# Patient Record
Sex: Female | Born: 1960 | Race: Black or African American | Hispanic: No | Marital: Married | Smoking: Never smoker
Health system: Southern US, Community
[De-identification: ages and names within clinical notes are randomized; demographics above are authoritative.]

## PROBLEM LIST (undated history)

## (undated) DIAGNOSIS — R011 Cardiac murmur, unspecified: Secondary | ICD-10-CM

## (undated) DIAGNOSIS — I499 Cardiac arrhythmia, unspecified: Secondary | ICD-10-CM

## (undated) DIAGNOSIS — M1990 Unspecified osteoarthritis, unspecified site: Secondary | ICD-10-CM

## (undated) DIAGNOSIS — K219 Gastro-esophageal reflux disease without esophagitis: Secondary | ICD-10-CM

## (undated) DIAGNOSIS — I10 Essential (primary) hypertension: Secondary | ICD-10-CM

## (undated) DIAGNOSIS — D249 Benign neoplasm of unspecified breast: Secondary | ICD-10-CM

## (undated) DIAGNOSIS — R519 Headache, unspecified: Secondary | ICD-10-CM

## (undated) DIAGNOSIS — D573 Sickle-cell trait: Secondary | ICD-10-CM

## (undated) DIAGNOSIS — R51 Headache: Secondary | ICD-10-CM

## (undated) DIAGNOSIS — T4145XA Adverse effect of unspecified anesthetic, initial encounter: Secondary | ICD-10-CM

## (undated) DIAGNOSIS — E119 Type 2 diabetes mellitus without complications: Secondary | ICD-10-CM

## (undated) HISTORY — PX: TONSILLECTOMY: SUR1361

## (undated) HISTORY — PX: TUBAL LIGATION: SHX77

## (undated) HISTORY — PX: ABDOMINAL HYSTERECTOMY: SHX81

## (undated) HISTORY — PX: CHOLECYSTECTOMY: SHX55

## (undated) HISTORY — PX: BREAST LUMPECTOMY: SHX2

## (undated) HISTORY — PX: BACK SURGERY: SHX140

---

## 2011-03-27 DIAGNOSIS — I499 Cardiac arrhythmia, unspecified: Secondary | ICD-10-CM

## 2011-03-27 HISTORY — DX: Cardiac arrhythmia, unspecified: I49.9

## 2015-06-25 DIAGNOSIS — T8859XA Other complications of anesthesia, initial encounter: Secondary | ICD-10-CM

## 2015-06-25 HISTORY — DX: Other complications of anesthesia, initial encounter: T88.59XA

## 2016-08-31 ENCOUNTER — Other Ambulatory Visit: Payer: Self-pay | Admitting: Neurosurgery

## 2016-09-06 ENCOUNTER — Encounter (HOSPITAL_COMMUNITY): Payer: Self-pay | Admitting: *Deleted

## 2016-09-06 ENCOUNTER — Encounter (HOSPITAL_COMMUNITY)
Admission: RE | Admit: 2016-09-06 | Discharge: 2016-09-06 | Disposition: A | Discharge status: Home / Self Care | Payer: PRIVATE HEALTH INSURANCE | Source: Ambulatory Visit | Attending: Neurosurgery | Admitting: Neurosurgery

## 2016-09-06 DIAGNOSIS — Z01812 Encounter for preprocedural laboratory examination: Secondary | ICD-10-CM | POA: Diagnosis present

## 2016-09-06 DIAGNOSIS — Z01810 Encounter for preprocedural cardiovascular examination: Secondary | ICD-10-CM | POA: Insufficient documentation

## 2016-09-06 DIAGNOSIS — I10 Essential (primary) hypertension: Secondary | ICD-10-CM | POA: Diagnosis not present

## 2016-09-06 HISTORY — DX: Adverse effect of unspecified anesthetic, initial encounter: T41.45XA

## 2016-09-06 HISTORY — DX: Sickle-cell trait: D57.3

## 2016-09-06 HISTORY — DX: Gastro-esophageal reflux disease without esophagitis: K21.9

## 2016-09-06 HISTORY — DX: Essential (primary) hypertension: I10

## 2016-09-06 HISTORY — DX: Headache, unspecified: R51.9

## 2016-09-06 HISTORY — DX: Type 2 diabetes mellitus without complications: E11.9

## 2016-09-06 HISTORY — DX: Headache: R51

## 2016-09-06 LAB — BASIC METABOLIC PANEL
Anion gap: 6 (ref 5–15)
BUN: 8 mg/dL (ref 6–20)
CHLORIDE: 99 mmol/L — AB (ref 101–111)
CO2: 29 mmol/L (ref 22–32)
CREATININE: 1.01 mg/dL — AB (ref 0.44–1.00)
Calcium: 9.5 mg/dL (ref 8.9–10.3)
GFR calc Af Amer: >60 mL/min (ref >60)
GFR calc non Af Amer: >60 mL/min (ref >60)
GLUCOSE: 217 mg/dL — AB (ref 65–99)
Potassium: 3.9 mmol/L (ref 3.5–5.1)
Sodium: 134 mmol/L — ABNORMAL LOW (ref 135–145)

## 2016-09-06 LAB — TYPE AND SCREEN
ABO/RH(D): O NEG
ANTIBODY SCREEN: NEGATIVE

## 2016-09-06 LAB — ABO/RH: ABO/RH(D): O NEG

## 2016-09-06 LAB — GLUCOSE, CAPILLARY: Glucose-Capillary: 239 mg/dL — ABNORMAL HIGH (ref 65–99)

## 2016-09-06 LAB — CBC
HEMATOCRIT: 36.2 % (ref 36.0–46.0)
HEMOGLOBIN: 12.2 g/dL (ref 12.0–15.0)
MCH: 27.9 pg (ref 26.0–34.0)
MCHC: 33.7 g/dL (ref 30.0–36.0)
MCV: 82.6 fL (ref 78.0–100.0)
Platelets: 225 10*3/uL (ref 150–400)
RBC: 4.38 MIL/uL (ref 3.87–5.11)
RDW: 14.2 % (ref 11.5–15.5)
WBC: 9.3 10*3/uL (ref 4.0–10.5)

## 2016-09-06 LAB — SURGICAL PCR SCREEN
MRSA, PCR: NEGATIVE
STAPHYLOCOCCUS AUREUS: NEGATIVE

## 2016-09-06 NOTE — Pre-Procedure Instructions (Signed)
Genessa Beman  09/06/2016      [id number] [place] [address] Phone: [phone] Fax: [phone]    Your procedure is scheduled on   Tuesday  09/11/16  Report to [facility] Admitting at 530 A.M.  Call this number if you have problems the morning of surgery:  [phone]   Remember:  Do not eat food or drink liquids after midnight.  Take these medicines the morning of surgery with A SIP OF WATER    CARVEDILOL (COREG),   OXYCODONE IF NEEDED  7 days prior to surgery STOP taking any Aspirin, Aleve, Naproxen, Ibuprofen, Motrin, Advil, Goody's, BC's, all herbal medications, fish oil, and all vitamins, VOLTAREN / DICLOFENAC, EXCEDRIN PM    How to Manage Your Diabetes Before and After Surgery  Why is it important to control my blood sugar before and after surgery? . Improving blood sugar levels before and after surgery helps healing and can limit problems. . A way of improving blood sugar control is eating a healthy diet by: o  Eating less sugar and carbohydrates o  Increasing activity/exercise o  Talking with your doctor about reaching your blood sugar goals . High blood sugars (greater than 180 mg/dL) can raise your risk of infections and slow your recovery, so you will need to focus on controlling your diabetes during the weeks before surgery. . Make sure that the doctor who takes care of your diabetes knows about your planned surgery including the date and location.  How do I manage my blood sugar before surgery? . Check your blood sugar at least 4 times a day, starting 2 days before surgery, to make sure that the level is not too high or low. o Check your blood sugar the morning of your surgery when you wake up and every 2 hours until you get to the Short Stay unit. . If your blood sugar is less than 70 mg/dL, you will need to treat for low blood sugar: o Do not take insulin. o Treat a low blood  sugar (less than 70 mg/dL) with  cup of clear juice (cranberry or apple), 4 glucose tablets, OR glucose gel. o Recheck blood sugar in 15 minutes after treatment (to make sure it is greater than 70 mg/dL). If your blood sugar is not greater than 70 mg/dL on recheck, call [phone] for further instructions. . Report your blood sugar to the short stay nurse when you get to Short Stay.  . If you are admitted to the hospital after surgery: o Your blood sugar will be checked by the staff and you will probably be given insulin after surgery (instead of oral diabetes medicines) to make sure you have good blood sugar levels. o The goal for blood sugar control after surgery is 80-180 mg/dL.              WHAT DO I DO ABOUT MY DIABETES MEDICATION?   Marland Kitchen Do not take oral diabetes medicines (pills) the morning of surgery.  . THE NIGHT BEFORE SURGERY, take __30_________ units of _  TRESIBA__________insulin.       Marland Kitchen HE MORNING OF SURGERY, take _____NONE________ units of __________insulin.  . The day of surgery, do not take other diabetes injectables, including Byetta (exenatide), Bydureon (exenatide ER), Victoza (liraglutide), or Trulicity (dulaglutide).  . If your CBG is greater than 220 mg/dL, you may take  of your sliding scale (correction) dose of insulin.  Other Instructions:          Patient Signature:  Date:   Nurse Signature:  Date:   Reviewed and Endorsed by [facility] Patient Education Committee, August 2015  Do not wear jewelry, make-up or nail polish.  Do not wear lotions, powders, or perfumes, or deoderant.  Do not shave 48 hours prior to surgery.  Men may shave face and neck.  Do not bring valuables to the hospital.  [facility] is not responsible for any belongings or valuables.  Contacts, dentures or bridgework may not be worn into surgery.  Leave your suitcase in the car.  After surgery it may be brought to your room.  For patients admitted to the hospital,  discharge time will be determined by your treatment team.  Patients discharged the day of surgery will not be allowed to drive home.   Name and phone number of your driver:    Special instructions:  [place] - Preparing for Surgery  Before surgery, you can play an important role.  Because skin is not sterile, your skin needs to be as free of germs as possible.  You can reduce the number of germs on you skin by washing with CHG (chlorahexidine gluconate) soap before surgery.  CHG is an antiseptic cleaner which kills germs and bonds with the skin to continue killing germs even after washing.  Please DO NOT use if you have an allergy to CHG or antibacterial soaps.  If your skin becomes reddened/irritated stop using the CHG and inform your nurse when you arrive at Short Stay.  Do not shave (including legs and underarms) for at least 48 hours prior to the first CHG shower.  You may shave your face.  Please follow these instructions carefully:   1.  Shower with CHG Soap the night before surgery and the                                morning of Surgery.  2.  If you choose to wash your hair, wash your hair first as usual with your       normal shampoo.  3.  After you shampoo, rinse your hair and body thoroughly to remove the                      Shampoo.  4.  Use CHG as you would any other liquid soap.  You can apply chg directly       to the skin and wash gently with scrungie or a clean washcloth.  5.  Apply the CHG Soap to your body ONLY FROM THE NECK DOWN.        Do not use on open wounds or open sores.  Avoid contact with your eyes,       ears, mouth and genitals (private parts).  Wash genitals (private parts)       with your normal soap.  6.  Wash thoroughly, paying special attention to the area where your surgery        will be performed.  7.  Thoroughly rinse your body with warm water from the neck down.  8.  DO NOT shower/wash with your normal soap after using and rinsing off       the CHG  Soap.  9.  Pat yourself dry with a clean towel.            10.  Wear clean  pajamas.            11.  Place clean sheets on your bed the night of your first shower and do not        sleep with pets.  Day of Surgery  Do not apply any lotions/deoderants the morning of surgery.  Please wear clean clothes to the hospital/surgery center.    Please read over the following fact sheets that you were given. MRSA Information and Surgical Site Infection Prevention

## 2016-09-06 NOTE — Progress Notes (Signed)
   09/06/16 0936  OBSTRUCTIVE SLEEP APNEA  Have you ever been diagnosed with sleep apnea through a sleep study? No  Do you snore loudly (loud enough to be heard through closed doors)?  1  Do you often feel tired, fatigued, or sleepy during the daytime (such as falling asleep during driving or talking to someone)? 0  Has anyone observed you stop breathing during your sleep? 0  Do you have, or are you being treated for high blood pressure? 1  BMI more than 35 kg/m2? 1  Age > 50 (1-yes) 1  Neck circumference greater than:Female 16 inches or larger, Female 17inches or larger? 1  Female Gender (Yes=1) 0  Obstructive Sleep Apnea Score 5  Score 5 or greater  Results sent to PCP

## 2016-09-07 ENCOUNTER — Encounter (HOSPITAL_COMMUNITY): Payer: Self-pay | Admitting: Emergency Medicine

## 2016-09-07 LAB — HEMOGLOBIN A1C
Hgb A1c MFr Bld: 6.9 % — ABNORMAL HIGH (ref 4.8–5.6)
Mean Plasma Glucose: 151 mg/dL

## 2016-09-07 NOTE — Progress Notes (Signed)
Anesthesia Chart Review:  Pt is a 56 year old female scheduled for L5-S1 PLIF on 09/11/2016 with Consuella Lose, MD.   PMH includes:  HTN, DM, GERD.  Former smoker. BMI 35.  Anesthesia history: pt reported at PAT her "bp dropped during breast surgery" in 2017 at [facility].  Records from 07/04/15 L breast biopsy obtained and are on paper chart.  I do not see any concerning hypotension documented in the anesthesia record.   Medications include: Carvedilol, lisinopril, metformin, simvastatin, tresiba  Reoperative labs reviewed. HbA1c 6.9, glucose 217  EKG 09/06/16: NSR. Minimal voltage criteria for LVH, may be normal variant  If blood glucose acceptable DOS, I anticipate pt can proceed as scheduled.   Willeen Cass, FNP-BC [facility] Short Stay Surgical Center/Anesthesiology Phone: [phone] 09/07/2016 10:09 AM

## 2016-09-11 ENCOUNTER — Inpatient Hospital Stay (HOSPITAL_COMMUNITY): Admission: RE | Admit: 2016-09-11 | Payer: PRIVATE HEALTH INSURANCE | Source: Ambulatory Visit | Admitting: Neurosurgery

## 2016-09-11 ENCOUNTER — Encounter (HOSPITAL_COMMUNITY): Admission: RE | Payer: Self-pay | Source: Ambulatory Visit

## 2016-09-11 SURGERY — POSTERIOR LUMBAR FUSION 1 LEVEL
Anesthesia: General

## 2016-11-13 ENCOUNTER — Other Ambulatory Visit: Payer: Self-pay | Admitting: Neurosurgery

## 2016-11-21 ENCOUNTER — Encounter (HOSPITAL_COMMUNITY): Payer: Self-pay

## 2016-11-21 ENCOUNTER — Encounter (HOSPITAL_COMMUNITY)
Admission: RE | Admit: 2016-11-21 | Discharge: 2016-11-21 | Disposition: A | Discharge status: Home / Self Care | Payer: PRIVATE HEALTH INSURANCE | Source: Ambulatory Visit | Attending: Neurosurgery | Admitting: Neurosurgery

## 2016-11-21 DIAGNOSIS — M5417 Radiculopathy, lumbosacral region: Secondary | ICD-10-CM | POA: Insufficient documentation

## 2016-11-21 DIAGNOSIS — M47897 Other spondylosis, lumbosacral region: Secondary | ICD-10-CM | POA: Diagnosis not present

## 2016-11-21 DIAGNOSIS — Z01812 Encounter for preprocedural laboratory examination: Secondary | ICD-10-CM | POA: Insufficient documentation

## 2016-11-21 HISTORY — DX: Unspecified osteoarthritis, unspecified site: M19.90

## 2016-11-21 HISTORY — DX: Cardiac arrhythmia, unspecified: I49.9

## 2016-11-21 HISTORY — DX: Cardiac murmur, unspecified: R01.1

## 2016-11-21 LAB — TYPE AND SCREEN
ABO/RH(D): O NEG
Antibody Screen: NEGATIVE

## 2016-11-21 LAB — BASIC METABOLIC PANEL
Anion gap: 5 (ref 5–15)
BUN: 6 mg/dL (ref 6–20)
CALCIUM: 9.5 mg/dL (ref 8.9–10.3)
CO2: 27 mmol/L (ref 22–32)
Chloride: 107 mmol/L (ref 101–111)
Creatinine, Ser: 0.99 mg/dL (ref 0.44–1.00)
GFR calc Af Amer: >60 mL/min (ref >60)
GLUCOSE: 184 mg/dL — AB (ref 65–99)
Potassium: 4.1 mmol/L (ref 3.5–5.1)
Sodium: 139 mmol/L (ref 135–145)

## 2016-11-21 LAB — CBC
HCT: 35.4 % — ABNORMAL LOW (ref 36.0–46.0)
Hemoglobin: 11.8 g/dL — ABNORMAL LOW (ref 12.0–15.0)
MCH: 26.9 pg (ref 26.0–34.0)
MCHC: 33.3 g/dL (ref 30.0–36.0)
MCV: 80.6 fL (ref 78.0–100.0)
PLATELETS: 194 10*3/uL (ref 150–400)
RBC: 4.39 MIL/uL (ref 3.87–5.11)
RDW: 13.8 % (ref 11.5–15.5)
WBC: 7.7 10*3/uL (ref 4.0–10.5)

## 2016-11-21 LAB — GLUCOSE, CAPILLARY: GLUCOSE-CAPILLARY: 204 mg/dL — AB (ref 65–99)

## 2016-11-21 LAB — SURGICAL PCR SCREEN
MRSA, PCR: NEGATIVE
STAPHYLOCOCCUS AUREUS: NEGATIVE

## 2016-11-21 LAB — HEMOGLOBIN A1C
Hgb A1c MFr Bld: 8 % — ABNORMAL HIGH (ref 4.8–5.6)
Mean Plasma Glucose: 182.9 mg/dL

## 2016-11-21 NOTE — Pre-Procedure Instructions (Addendum)
Christy Brewer  11/21/2016      [id number] [place] [address] Phone: [phone] Fax: [phone]    Your procedure is scheduled on 11/27/16  Report to [facility] Admitting at 745 A.M.  Call this number if you have problems the morning of surgery:  [phone]   Remember:  Do not eat food or drink liquids after midnight.  Take these medicines the morning of surgery with A SIP OF WATER      Carvedilol(coreg)  STOP all herbel meds, nsaids (aleve,naproxen,advil,ibuprofen) prior to surgery starting today (11/21/16) including all vitamins/supplements,excedrin,probiotic,aspirin,fish oil  No metformin am of surgery  How to Manage Your Diabetes Before and After Surgery  Why is it important to control my blood sugar before and after surgery? . Improving blood sugar levels before and after surgery helps healing and can limit problems. . A way of improving blood sugar control is eating a healthy diet by: o  Eating less sugar and carbohydrates o  Increasing activity/exercise o  Talking with your doctor about reaching your blood sugar goals . High blood sugars (greater than 180 mg/dL) can raise your risk of infections and slow your recovery, so you will need to focus on controlling your diabetes during the weeks before surgery. . Make sure that the doctor who takes care of your diabetes knows about your planned surgery including the date and location.  How do I manage my blood sugar before surgery? . Check your blood sugar at least 4 times a day, starting 2 days before surgery, to make sure that the level is not too high or low. o Check your blood sugar the morning of your surgery when you wake up and every 2 hours until you get to the Short Stay unit. . If your blood sugar is less than 70 mg/dL, you will need to treat for low blood sugar: o Do not take insulin. o Treat a low blood sugar (less than 70 mg/dL)  with  cup of clear juice (cranberry or apple), 4 glucose tablets, OR glucose gel. o Recheck blood sugar in 15 minutes after treatment (to make sure it is greater than 70 mg/dL). If your blood sugar is not greater than 70 mg/dL on recheck, call [phone] for further instructions. . Report your blood sugar to the short stay nurse when you get to Short Stay.  . If you are admitted to the hospital after surgery: o Your blood sugar will be checked by the staff and you will probably be given insulin after surgery (instead of oral diabetes medicines) to make sure you have good blood sugar levels. o The goal for blood sugar control after surgery is 80-180 mg/dL.   WHAT DO I DO ABOUT MY DIABETES MEDICATION?  Do not take oral diabetes medicines (pills) the morning of surgery.(metformin)  . THE NIGHT BEFORE SURGERY, take 30 units of Tresiba insulin.  . Take usual meal doses of Fiasp but no bedtime dose .  .     .  THE MORNING OF SURGERY,do not take any Fiasp insulin . Except as instructed below  . If your CBG is greater than 220 mg/dL, you may take  of your sliding scale (correction) dose of insulin.    Do not wear jewelry, make-up or nail polish.  Do not wear lotions, powders, or perfumes, or deoderant.  Do not shave 48 hours prior to surgery.  Men may shave face and  neck.  Do not bring valuables to the hospital.  [facility] is not responsible for any belongings or valuables.  Contacts, dentures or bridgework may not be worn into surgery.  Leave your suitcase in the car.  After surgery it may be brought to your room.  For patients admitted to the hospital, discharge time will be determined by your treatment team.  Patients discharged the day of surgery will not be allowed to drive home.   Special instructions:   Special Instructions: [place] - Preparing for Surgery  Before surgery, you can play an important role.  Because skin is not sterile, your skin needs to be as free of  germs as possible.  You can reduce the number of germs on you skin by washing with CHG (chlorahexidine gluconate) soap before surgery.  CHG is an antiseptic cleaner which kills germs and bonds with the skin to continue killing germs even after washing.  Please DO NOT use if you have an allergy to CHG or antibacterial soaps.  If your skin becomes reddened/irritated stop using the CHG and inform your nurse when you arrive at Short Stay.  Do not shave (including legs and underarms) for at least 48 hours prior to the first CHG shower.  You may shave your face.  Please follow these instructions carefully:   1.  Shower with CHG Soap the night before surgery and the morning of Surgery.  2.  If you choose to wash your hair, wash your hair first as usual with your normal shampoo.  3.  After you shampoo, rinse your hair and body thoroughly to remove the Shampoo.  4.  Use CHG as you would any other liquid soap.  You can apply chg directly  to the skin and wash gently with scrungie or a clean washcloth.  5.  Apply the CHG Soap to your body ONLY FROM THE NECK DOWN.  Do not use on open wounds or open sores.  Avoid contact with your eyes ears, mouth and genitals (private parts).  Wash genitals (private parts)       with your normal soap.  6.  Wash thoroughly, paying special attention to the area where your surgery will be performed.  7.  Thoroughly rinse your body with warm water from the neck down.  8.  DO NOT shower/wash with your normal soap after using and rinsing off the CHG Soap.  9.  Pat yourself dry with a clean towel.            10.  Wear clean pajamas.            11.  Place clean sheets on your bed the night of your first shower and do not sleep with pets.  Day of Surgery  Do not apply any lotions/deodorants the morning of surgery.  Please wear clean clothes to the hospital/surgery center.  Please read over the  fact sheets that you were given.

## 2016-11-30 NOTE — Progress Notes (Signed)
Anesthesia Chart Review:   Pt is a 56 year old female scheduled for L5-S1 PLIF on 01/01/2017 with Consuella Lose, MD.   - Pt saw cardiologist Raechel Chute, MD in [place], [place] in 2012 for cardiac risk factors.   PMH includes:  HTN, DM, GERD.  Former smoker. BMI 35.  Anesthesia history: pt reported at PAT her "bp dropped during breast surgery" in 2017 at [facility]  Records from 07/04/15 L breast biopsy obtained and reviewed.  I do not see any concerning hypotension documented in the anesthesia record.   Medications include: Carvedilol, fiasp, lisinopril, metformin, simvastatin, tresiba  Reoperative labs reviewed. HbA1c 8.0, glucose 184  EKG 09/06/16: NSR. Minimal voltage criteria for LVH, may be normal variant  Echo 07/03/10 ([facility]): 1. Limited endocardial resolution. 2. Normal RV and LV sizes and systolic function. EF 60-65%. 3. Structurally and functionally normal cardiac valves. 4. Normal estimated RA and LA pressures  Nuclear stress test 07/03/10 ([facility] of [facility]):  1. No induced ischemia, on 85% PMHR.  2. Deconditioning 3. Normal resting systolic LV function. Normal segmental wall motion and thickening. EF 69%  If blood glucose acceptable DOS, I anticipate pt can proceed as scheduled.   Willeen Cass, FNP-BC [facility] Short Stay Surgical Center/Anesthesiology Phone: [phone] 11/30/2016 2:54 PM

## 2016-12-25 ENCOUNTER — Other Ambulatory Visit (HOSPITAL_COMMUNITY): Payer: Self-pay | Admitting: General Practice

## 2016-12-25 NOTE — Pre-Procedure Instructions (Signed)
Christy Brewer  12/25/2016      Your procedure is scheduled on 01/01/2017.  Report to [facility] Admitting at 05:30 A.M.  Call this number if you have problems the morning of surgery:  [phone]   Remember:  Do not eat food or drink liquids after midnight.  Take these medicines the morning of surgery with A SIP OF WATER: carvedilol and neurontin.    WHAT DO I DO ABOUT MY DIABETES MEDICATION?   Marland Kitchen Do not take oral diabetes medicines (pills) the morning of surgery (metformin).  . THE NIGHT BEFORE SURGERY, take 30 units of tresiba insulin.   . Do not take FIASP flextouch the day of surgery.       How to Manage Your Diabetes Before and After Surgery  Why is it important to control my blood sugar before and after surgery? . Improving blood sugar levels before and after surgery helps healing and can limit problems. . A way of improving blood sugar control is eating a healthy diet by: o  Eating less sugar and carbohydrates o  Increasing activity/exercise o  Talking with your doctor about reaching your blood sugar goals . High blood sugars (greater than 180 mg/dL) can raise your risk of infections and slow your recovery, so you will need to focus on controlling your diabetes during the weeks before surgery. . Make sure that the doctor who takes care of your diabetes knows about your planned surgery including the date and location.  How do I manage my blood sugar before surgery? . Check your blood sugar at least 4 times a day, starting 2 days before surgery, to make sure that the level is not too high or low. o Check your blood sugar the morning of your surgery when you wake up and every 2 hours until you get to the Short Stay unit. . If your blood sugar is less than 70 mg/dL, you will need to treat for low blood sugar: o Do not take insulin. o Treat a low blood sugar (less than 70 mg/dL) with  cup of clear juice (cranberry or apple), 4 glucose tablets, OR glucose  gel. o Recheck blood sugar in 15 minutes after treatment (to make sure it is greater than 70 mg/dL). If your blood sugar is not greater than 70 mg/dL on recheck, call [phone] for further instructions. . Report your blood sugar to the short stay nurse when you get to Short Stay.  . If you are admitted to the hospital after surgery: o Your blood sugar will be checked by the staff and you will probably be given insulin after surgery (instead of oral diabetes medicines) to make sure you have good blood sugar levels. o The goal for blood sugar control after surgery is 80-180 mg/dL.     Do not wear jewelry, make-up or nail polish.  Do not wear lotions, powders, or perfumes, or deoderant.  Do not shave 48 hours prior to surgery.   Do not bring valuables to the hospital.  [facility] is not responsible for any belongings or valuables.  Contacts, dentures or bridgework may not be worn into surgery.  Leave your suitcase in the car.  After surgery it may be brought to your room.  For patients admitted to the hospital, discharge time will be determined by your treatment team.  Patients discharged the day of surgery will not be allowed to drive home.   Please read over the fact sheets that you were given.  [place]- Preparing For Surgery  Before surgery, you can play an important role. Because skin is not sterile, your skin needs to be as free of germs as possible. You can reduce the number of germs on your skin by washing with CHG (chlorahexidine gluconate) Soap before surgery.  CHG is an antiseptic cleaner which kills germs and bonds with the skin to continue killing germs even after washing.  Please do not use if you have an allergy to CHG or antibacterial soaps. If your skin becomes reddened/irritated stop using the CHG.  Do not shave (including legs and underarms) for at least 48 hours prior to first CHG shower. It is OK to shave your face.  Please follow these instructions  carefully.   1. Shower the NIGHT BEFORE SURGERY and the MORNING OF SURGERY with CHG.   2. If you chose to wash your hair, wash your hair first as usual with your normal shampoo.  3. After you shampoo, rinse your hair and body thoroughly to remove the shampoo.  4. Use CHG as you would any other liquid soap. You can apply CHG directly to the skin and wash gently with a scrungie or a clean washcloth.   5. Apply the CHG Soap to your body ONLY FROM THE NECK DOWN.  Do not use on open wounds or open sores. Avoid contact with your eyes, ears, mouth and genitals (private parts). Wash genitals (private parts) with your normal soap.  USE REGULAR SHAMPOO AND CONDITIONER FOR HAIR USE REGULAR SOAP FOR FACE AND PRIVATE AREA  6. Wash thoroughly, paying special attention to the area where your surgery will be performed.  7. Thoroughly rinse your body with warm water from the neck down.  8. DO NOT shower/wash with your normal soap after using and rinsing off the CHG Soap.  9. Pat yourself dry with a CLEAN TOWEL and [place] CLOTH  10. Wear CLEAN PAJAMAS to bed the night before surgery, wear comfortable clothes the morning of surgery  11. Place CLEAN SHEETS on your bed the night of your first shower and DO NOT SLEEP WITH PETS.    Day of Surgery: Do not apply any deodorants/lotions. Please wear clean clothes to the hospital/surgery center.

## 2016-12-26 ENCOUNTER — Other Ambulatory Visit: Payer: Self-pay | Admitting: Neurosurgery

## 2016-12-26 ENCOUNTER — Encounter (HOSPITAL_COMMUNITY): Payer: Self-pay

## 2016-12-26 ENCOUNTER — Encounter (HOSPITAL_COMMUNITY)
Admission: RE | Admit: 2016-12-26 | Discharge: 2016-12-26 | Disposition: A | Discharge status: Home / Self Care | Payer: PRIVATE HEALTH INSURANCE | Source: Ambulatory Visit | Attending: Neurosurgery | Admitting: Neurosurgery

## 2016-12-26 DIAGNOSIS — I10 Essential (primary) hypertension: Secondary | ICD-10-CM | POA: Insufficient documentation

## 2016-12-26 DIAGNOSIS — Z01818 Encounter for other preprocedural examination: Secondary | ICD-10-CM | POA: Insufficient documentation

## 2016-12-26 DIAGNOSIS — Z7984 Long term (current) use of oral hypoglycemic drugs: Secondary | ICD-10-CM | POA: Diagnosis not present

## 2016-12-26 DIAGNOSIS — Z87891 Personal history of nicotine dependence: Secondary | ICD-10-CM | POA: Insufficient documentation

## 2016-12-26 DIAGNOSIS — E119 Type 2 diabetes mellitus without complications: Secondary | ICD-10-CM | POA: Insufficient documentation

## 2016-12-26 HISTORY — DX: Benign neoplasm of unspecified breast: D24.9

## 2016-12-26 LAB — CBC
HCT: 37.2 % (ref 36.0–46.0)
Hemoglobin: 12.6 g/dL (ref 12.0–15.0)
MCH: 27.3 pg (ref 26.0–34.0)
MCHC: 33.9 g/dL (ref 30.0–36.0)
MCV: 80.5 fL (ref 78.0–100.0)
PLATELETS: 215 10*3/uL (ref 150–400)
RBC: 4.62 MIL/uL (ref 3.87–5.11)
RDW: 14 % (ref 11.5–15.5)
WBC: 7.6 10*3/uL (ref 4.0–10.5)

## 2016-12-26 LAB — BASIC METABOLIC PANEL
Anion gap: 7 (ref 5–15)
BUN: 7 mg/dL (ref 6–20)
CALCIUM: 9.7 mg/dL (ref 8.9–10.3)
CO2: 28 mmol/L (ref 22–32)
CREATININE: 1.08 mg/dL — AB (ref 0.44–1.00)
Chloride: 102 mmol/L (ref 101–111)
GFR calc non Af Amer: 57 mL/min — ABNORMAL LOW (ref >60)
Glucose, Bld: 157 mg/dL — ABNORMAL HIGH (ref 65–99)
Potassium: 4.2 mmol/L (ref 3.5–5.1)
SODIUM: 137 mmol/L (ref 135–145)

## 2016-12-26 LAB — TYPE AND SCREEN
ABO/RH(D): O NEG
ANTIBODY SCREEN: NEGATIVE

## 2016-12-26 LAB — SURGICAL PCR SCREEN
MRSA, PCR: NEGATIVE
STAPHYLOCOCCUS AUREUS: NEGATIVE

## 2016-12-26 LAB — GLUCOSE, CAPILLARY: GLUCOSE-CAPILLARY: 168 mg/dL — AB (ref 65–99)

## 2016-12-26 NOTE — Progress Notes (Signed)
Called Nundkumar's office for orders for consent.  PCP: Dr. Melina Fiddler @ [facility] in [place]  Cardiologist: Dr. Raechel Chute in [place], [place]-hasn't seen since 2012.  Endocrinologist: Dr. Johann Capers Trivitt in [place], [place]  Fasting sugars 278-300. (pt. States it's due to stress)  Notified Myriam Forehand, diabetic Coordinator, stated pt. Should contact her endocrinologist for better control. Stated for pt. Not to take Fiasp flexpen the morning of surgery only unless it is a correction dose. Pt. States it is not.

## 2016-12-26 NOTE — Pre-Procedure Instructions (Addendum)
Christy Brewer  12/26/2016      Your procedure is scheduled on 01/01/2017.  Report to [facility] Admitting at 05:30 A.M.  Call this number if you have problems the morning of surgery:  [phone]   Remember:  Do not eat food or drink liquids after midnight.  Take these medicines the morning of surgery with A SIP OF WATER: carvedilol and neurontin.    WHAT DO I DO ABOUT MY DIABETES MEDICATION?   Marland Kitchen Do not take oral diabetes medicines (pills) the morning of surgery (metformin).  . THE NIGHT BEFORE SURGERY, take 30 units of tresiba insulin.     How to Manage Your Diabetes Before and After Surgery  Why is it important to control my blood sugar before and after surgery? . Improving blood sugar levels before and after surgery helps healing and can limit problems. . A way of improving blood sugar control is eating a healthy diet by: o  Eating less sugar and carbohydrates o  Increasing activity/exercise o  Talking with your doctor about reaching your blood sugar goals . High blood sugars (greater than 180 mg/dL) can raise your risk of infections and slow your recovery, so you will need to focus on controlling your diabetes during the weeks before surgery. . Make sure that the doctor who takes care of your diabetes knows about your planned surgery including the date and location.  How do I manage my blood sugar before surgery? . Check your blood sugar at least 4 times a day, starting 2 days before surgery, to make sure that the level is not too high or low. o Check your blood sugar the morning of your surgery when you wake up and every 2 hours until you get to the Short Stay unit. . If your blood sugar is less than 70 mg/dL, you will need to treat for low blood sugar: o Do not take insulin. o Treat a low blood sugar (less than 70 mg/dL) with  cup of clear juice (cranberry or apple), 4 glucose tablets, OR glucose gel. o Recheck blood sugar in 15 minutes after treatment  (to make sure it is greater than 70 mg/dL). If your blood sugar is not greater than 70 mg/dL on recheck, call [phone] for further instructions. . Report your blood sugar to the short stay nurse when you get to Short Stay.  . If you are admitted to the hospital after surgery: o Your blood sugar will be checked by the staff and you will probably be given insulin after surgery (instead of oral diabetes medicines) to make sure you have good blood sugar levels. o The goal for blood sugar control after surgery is 80-180 mg/dL.     Do not wear jewelry, make-up or nail polish.  Do not wear lotions, powders, or perfumes, or deoderant.  Do not shave 48 hours prior to surgery.   Do not bring valuables to the hospital.  [facility] is not responsible for any belongings or valuables.  Contacts, dentures or bridgework may not be worn into surgery.  Leave your suitcase in the car.  After surgery it may be brought to your room.  For patients admitted to the hospital, discharge time will be determined by your treatment team.  Patients discharged the day of surgery will not be allowed to drive home.   Please read over the fact sheets that you were given.    [facility]- Preparing For Surgery  Before surgery, you can play an  important role. Because skin is not sterile, your skin needs to be as free of germs as possible. You can reduce the number of germs on your skin by washing with CHG (chlorahexidine gluconate) Soap before surgery.  CHG is an antiseptic cleaner which kills germs and bonds with the skin to continue killing germs even after washing.  Please do not use if you have an allergy to CHG or antibacterial soaps. If your skin becomes reddened/irritated stop using the CHG.  Do not shave (including legs and underarms) for at least 48 hours prior to first CHG shower. It is OK to shave your face.  Please follow these instructions carefully.   1. Shower the NIGHT BEFORE SURGERY and the MORNING  OF SURGERY with CHG.   2. If you chose to wash your hair, wash your hair first as usual with your normal shampoo.  3. After you shampoo, rinse your hair and body thoroughly to remove the shampoo.  4. Use CHG as you would any other liquid soap. You can apply CHG directly to the skin and wash gently with a scrungie or a clean washcloth.   5. Apply the CHG Soap to your body ONLY FROM THE NECK DOWN.  Do not use on open wounds or open sores. Avoid contact with your eyes, ears, mouth and genitals (private parts). Wash genitals (private parts) with your normal soap.  USE REGULAR SHAMPOO AND CONDITIONER FOR HAIR USE REGULAR SOAP FOR FACE AND PRIVATE AREA  6. Wash thoroughly, paying special attention to the area where your surgery will be performed.  7. Thoroughly rinse your body with warm water from the neck down.  8. DO NOT shower/wash with your normal soap after using and rinsing off the CHG Soap.  9. Pat yourself dry with a CLEAN TOWEL and [place] CLOTH  10. Wear CLEAN PAJAMAS to bed the night before surgery, wear comfortable clothes the morning of surgery  11. Place CLEAN SHEETS on your bed the night of your first shower and DO NOT SLEEP WITH PETS.    Day of Surgery: Do not apply any deodorants/lotions. Please wear clean clothes to the hospital/surgery center.

## 2016-12-27 NOTE — Progress Notes (Signed)
Anesthesia Chart Review:  Pt is a 56 year old female scheduled for L5-S1 PLIF on 01/01/2017 with Consuella Lose, MD.   - Pt saw cardiologist Raechel Chute, MD in [place], [place] in 2012 for cardiac risk factors.   PMH includes: HTN, DM, GERD. Former smoker. BMI 34.  Anesthesia history: pt reported at PAT her "bp dropped during breast surgery" in 2017 at [facility]. Records from 07/04/15 L breast biopsy obtained and reviewed. I do not see any concerning hypotension documented in the anesthesia record.   Medications include: Carvedilol, fiasp, lisinopril, metformin, simvastatin, tresiba  Preoperative labs reviewed. Glucose 157. HbA1c 8.0 on 11/21/16  EKG 09/06/16: NSR. Minimal voltage criteria for LVH, may be normal variant  Echo 07/03/10 ([facility]): 1. Limited endocardial resolution. 2. Normal RV and LV sizes and systolic function. EF 60-65%. 3. Structurally and functionally normal cardiac valves. 4. Normal estimated RA and LA pressures  Nuclear stress test 07/03/10 ([facility]):  1. No induced ischemia, on 85% PMHR.  2. Deconditioning 3. Normal resting systolic LV function. Normal segmental wall motion and thickening. EF 69%  If blood glucose acceptable DOS, I anticipate pt can proceed as scheduled.   Willeen Cass, FNP-BC [facility] Short Stay Surgical Center/Anesthesiology Phone: [phone] 12/27/2016 3:55 PM

## 2016-12-31 MED ORDER — CEFAZOLIN SODIUM-DEXTROSE 2-4 GM/100ML-% IV SOLN
2.0000 g | INTRAVENOUS | Status: AC
  Administered 2017-01-01: 2 g via INTRAVENOUS

## 2017-01-01 ENCOUNTER — Inpatient Hospital Stay (HOSPITAL_COMMUNITY)
Admission: RE | Admit: 2017-01-01 | Discharge: 2017-01-02 | DRG: 460 | Disposition: A | Discharge status: Home / Self Care | Payer: PRIVATE HEALTH INSURANCE | Source: Ambulatory Visit | Attending: Neurosurgery | Admitting: Neurosurgery

## 2017-01-01 ENCOUNTER — Encounter (HOSPITAL_COMMUNITY)
Admission: RE | Disposition: A | Discharge status: Home / Self Care | Payer: Self-pay | Source: Ambulatory Visit | Attending: Neurosurgery

## 2017-01-01 ENCOUNTER — Inpatient Hospital Stay (HOSPITAL_COMMUNITY): Payer: PRIVATE HEALTH INSURANCE | Admitting: Vascular Surgery

## 2017-01-01 ENCOUNTER — Encounter (HOSPITAL_COMMUNITY): Payer: Self-pay | Admitting: Urology

## 2017-01-01 ENCOUNTER — Inpatient Hospital Stay (HOSPITAL_COMMUNITY): Payer: PRIVATE HEALTH INSURANCE

## 2017-01-01 ENCOUNTER — Inpatient Hospital Stay (HOSPITAL_COMMUNITY): Payer: PRIVATE HEALTH INSURANCE | Admitting: Certified Registered Nurse Anesthetist

## 2017-01-01 DIAGNOSIS — M4726 Other spondylosis with radiculopathy, lumbar region: Secondary | ICD-10-CM | POA: Diagnosis present

## 2017-01-01 DIAGNOSIS — K219 Gastro-esophageal reflux disease without esophagitis: Secondary | ICD-10-CM | POA: Diagnosis present

## 2017-01-01 DIAGNOSIS — M5117 Intervertebral disc disorders with radiculopathy, lumbosacral region: Secondary | ICD-10-CM | POA: Diagnosis present

## 2017-01-01 DIAGNOSIS — D573 Sickle-cell trait: Secondary | ICD-10-CM | POA: Diagnosis present

## 2017-01-01 DIAGNOSIS — I10 Essential (primary) hypertension: Secondary | ICD-10-CM | POA: Diagnosis present

## 2017-01-01 DIAGNOSIS — M4727 Other spondylosis with radiculopathy, lumbosacral region: Secondary | ICD-10-CM | POA: Diagnosis present

## 2017-01-01 DIAGNOSIS — Z7984 Long term (current) use of oral hypoglycemic drugs: Secondary | ICD-10-CM | POA: Diagnosis not present

## 2017-01-01 DIAGNOSIS — Z419 Encounter for procedure for purposes other than remedying health state, unspecified: Secondary | ICD-10-CM

## 2017-01-01 DIAGNOSIS — E119 Type 2 diabetes mellitus without complications: Secondary | ICD-10-CM | POA: Diagnosis present

## 2017-01-01 DIAGNOSIS — Z79899 Other long term (current) drug therapy: Secondary | ICD-10-CM | POA: Diagnosis not present

## 2017-01-01 LAB — GLUCOSE, CAPILLARY
GLUCOSE-CAPILLARY: 196 mg/dL — AB (ref 65–99)
GLUCOSE-CAPILLARY: 238 mg/dL — AB (ref 65–99)
GLUCOSE-CAPILLARY: 192 mg/dL — AB (ref 65–99)
Glucose-Capillary: 285 mg/dL — ABNORMAL HIGH (ref 65–99)

## 2017-01-01 SURGERY — POSTERIOR LUMBAR FUSION 1 LEVEL
Anesthesia: General | Site: Back

## 2017-01-01 MED ORDER — DOCUSATE SODIUM 100 MG PO CAPS
100.0000 mg | ORAL_CAPSULE | Freq: Two times a day (BID) | ORAL | Status: DC
  Administered 2017-01-01 – 2017-01-02 (×3): 100 mg via ORAL
  Filled 2017-01-01 (×3): qty 1

## 2017-01-01 MED ORDER — BUPIVACAINE HCL (PF) 0.5 % IJ SOLN
INTRAMUSCULAR | Status: AC
  Filled 2017-01-01: qty 30

## 2017-01-01 MED ORDER — SODIUM CHLORIDE 0.9 % IR SOLN
Status: DC | PRN
  Administered 2017-01-01: 10:00:00

## 2017-01-01 MED ORDER — ONDANSETRON HCL 4 MG/2ML IJ SOLN
INTRAMUSCULAR | Status: AC
  Filled 2017-01-01: qty 2

## 2017-01-01 MED ORDER — SIMVASTATIN 20 MG PO TABS
20.0000 mg | ORAL_TABLET | Freq: Every evening | ORAL | Status: DC
  Administered 2017-01-01: 20 mg via ORAL
  Filled 2017-01-01: qty 1

## 2017-01-01 MED ORDER — LIDOCAINE-EPINEPHRINE 1 %-1:100000 IJ SOLN
INTRAMUSCULAR | Status: AC
  Filled 2017-01-01: qty 1

## 2017-01-01 MED ORDER — LIDOCAINE-EPINEPHRINE 1 %-1:100000 IJ SOLN
INTRAMUSCULAR | Status: DC | PRN
  Administered 2017-01-01: 5 mL

## 2017-01-01 MED ORDER — CARVEDILOL 12.5 MG PO TABS
12.5000 mg | ORAL_TABLET | Freq: Once | ORAL | Status: AC
  Administered 2017-01-01: 12.5 mg via ORAL
  Filled 2017-01-01 (×2): qty 1

## 2017-01-01 MED ORDER — PHENYLEPHRINE 40 MCG/ML (10ML) SYRINGE FOR IV PUSH (FOR BLOOD PRESSURE SUPPORT)
PREFILLED_SYRINGE | INTRAVENOUS | Status: AC
  Filled 2017-01-01: qty 10

## 2017-01-01 MED ORDER — SENNA 8.6 MG PO TABS
1.0000 | ORAL_TABLET | Freq: Two times a day (BID) | ORAL | Status: DC
  Administered 2017-01-01 – 2017-01-02 (×3): 8.6 mg via ORAL
  Filled 2017-01-01 (×3): qty 1

## 2017-01-01 MED ORDER — SODIUM CHLORIDE 0.9% FLUSH
3.0000 mL | Freq: Two times a day (BID) | INTRAVENOUS | Status: DC
  Administered 2017-01-01: 3 mL via INTRAVENOUS

## 2017-01-01 MED ORDER — ONDANSETRON HCL 4 MG/2ML IJ SOLN
INTRAMUSCULAR | Status: DC | PRN
  Administered 2017-01-01: 4 mg via INTRAVENOUS

## 2017-01-01 MED ORDER — MIDAZOLAM HCL 2 MG/2ML IJ SOLN: 0.5000 mg | Freq: Once | INTRAMUSCULAR | Status: DC | PRN

## 2017-01-01 MED ORDER — LACTATED RINGERS IV SOLN
INTRAVENOUS | Status: DC | PRN
  Administered 2017-01-01 (×2): via INTRAVENOUS

## 2017-01-01 MED ORDER — SODIUM CHLORIDE 0.9% FLUSH: 3.0000 mL | INTRAVENOUS | Status: DC | PRN

## 2017-01-01 MED ORDER — SODIUM CHLORIDE 0.9 % IV SOLN: INTRAVENOUS | Status: DC

## 2017-01-01 MED ORDER — ROCURONIUM BROMIDE 10 MG/ML (PF) SYRINGE
PREFILLED_SYRINGE | INTRAVENOUS | Status: AC
  Filled 2017-01-01: qty 5

## 2017-01-01 MED ORDER — BACID PO TABS
1.0000 | ORAL_TABLET | Freq: Every day | ORAL | Status: DC
  Administered 2017-01-01 – 2017-01-02 (×2): 1 via ORAL
  Filled 2017-01-01 (×2): qty 1

## 2017-01-01 MED ORDER — OXYCODONE HCL 5 MG PO TABS
5.0000 mg | ORAL_TABLET | ORAL | Status: DC | PRN
  Administered 2017-01-01 – 2017-01-02 (×5): 10 mg via ORAL
  Filled 2017-01-01 (×4): qty 2

## 2017-01-01 MED ORDER — METHOCARBAMOL 500 MG PO TABS
500.0000 mg | ORAL_TABLET | Freq: Four times a day (QID) | ORAL | Status: DC | PRN
  Administered 2017-01-01 – 2017-01-02 (×3): 500 mg via ORAL
  Filled 2017-01-01 (×2): qty 1

## 2017-01-01 MED ORDER — CHLORHEXIDINE GLUCONATE CLOTH 2 % EX PADS: 6.0000 | MEDICATED_PAD | Freq: Once | CUTANEOUS | Status: DC

## 2017-01-01 MED ORDER — PHENYLEPHRINE 40 MCG/ML (10ML) SYRINGE FOR IV PUSH (FOR BLOOD PRESSURE SUPPORT)
PREFILLED_SYRINGE | INTRAVENOUS | Status: DC | PRN
  Administered 2017-01-01: 80 ug via INTRAVENOUS

## 2017-01-01 MED ORDER — SUGAMMADEX SODIUM 200 MG/2ML IV SOLN
INTRAVENOUS | Status: AC
  Filled 2017-01-01: qty 2

## 2017-01-01 MED ORDER — METFORMIN HCL 500 MG PO TABS
1000.0000 mg | ORAL_TABLET | Freq: Two times a day (BID) | ORAL | Status: DC
  Administered 2017-01-01 – 2017-01-02 (×2): 1000 mg via ORAL
  Filled 2017-01-01 (×2): qty 2

## 2017-01-01 MED ORDER — LIDOCAINE 2% (20 MG/ML) 5 ML SYRINGE
INTRAMUSCULAR | Status: DC | PRN
  Administered 2017-01-01: 30 mg via INTRAVENOUS

## 2017-01-01 MED ORDER — FLUTICASONE PROPIONATE 50 MCG/ACT NA SUSP
2.0000 | Freq: Every day | NASAL | Status: DC
  Administered 2017-01-01 – 2017-01-02 (×2): 2 via NASAL
  Filled 2017-01-01: qty 16

## 2017-01-01 MED ORDER — VITAMIN B-12 1000 MCG PO TABS
5000.0000 ug | ORAL_TABLET | Freq: Every day | ORAL | Status: DC
  Administered 2017-01-01 – 2017-01-02 (×2): 5000 ug via ORAL
  Filled 2017-01-01 (×2): qty 5

## 2017-01-01 MED ORDER — LIDOCAINE 2% (20 MG/ML) 5 ML SYRINGE
INTRAMUSCULAR | Status: AC
  Filled 2017-01-01: qty 5

## 2017-01-01 MED ORDER — ACETAMINOPHEN 500 MG PO TABS
1000.0000 mg | ORAL_TABLET | Freq: Four times a day (QID) | ORAL | Status: AC
  Administered 2017-01-01 – 2017-01-02 (×4): 1000 mg via ORAL
  Filled 2017-01-01 (×4): qty 2

## 2017-01-01 MED ORDER — DEXAMETHASONE SODIUM PHOSPHATE 10 MG/ML IJ SOLN
INTRAMUSCULAR | Status: AC
  Filled 2017-01-01: qty 1

## 2017-01-01 MED ORDER — 0.9 % SODIUM CHLORIDE (POUR BTL) OPTIME
TOPICAL | Status: DC | PRN
  Administered 2017-01-01: 1000 mL

## 2017-01-01 MED ORDER — ONDANSETRON HCL 4 MG/2ML IJ SOLN: 4.0000 mg | Freq: Four times a day (QID) | INTRAMUSCULAR | Status: DC | PRN

## 2017-01-01 MED ORDER — INSULIN DEGLUDEC 100 UNIT/ML ~~LOC~~ SOPN: 60.0000 [IU] | PEN_INJECTOR | Freq: Every day | SUBCUTANEOUS | Status: DC

## 2017-01-01 MED ORDER — BUPIVACAINE HCL (PF) 0.5 % IJ SOLN
INTRAMUSCULAR | Status: DC | PRN
  Administered 2017-01-01: 5 mL

## 2017-01-01 MED ORDER — ONDANSETRON HCL 4 MG PO TABS: 4.0000 mg | ORAL_TABLET | Freq: Four times a day (QID) | ORAL | Status: DC | PRN

## 2017-01-01 MED ORDER — THROMBIN 20000 UNITS EX SOLR
CUTANEOUS | Status: DC | PRN
  Administered 2017-01-01: 10:00:00 via TOPICAL

## 2017-01-01 MED ORDER — INSULIN ASPART 100 UNIT/ML ~~LOC~~ SOLN
12.0000 [IU] | Freq: Three times a day (TID) | SUBCUTANEOUS | Status: DC
  Administered 2017-01-01 – 2017-01-02 (×2): 12 [IU] via SUBCUTANEOUS

## 2017-01-01 MED ORDER — KETOROLAC TROMETHAMINE 15 MG/ML IJ SOLN
INTRAMUSCULAR | Status: AC
  Filled 2017-01-01: qty 1

## 2017-01-01 MED ORDER — INSULIN GLARGINE 100 UNIT/ML ~~LOC~~ SOLN
60.0000 [IU] | Freq: Every day | SUBCUTANEOUS | Status: DC
  Administered 2017-01-01: 60 [IU] via SUBCUTANEOUS
  Filled 2017-01-01: qty 0.6

## 2017-01-01 MED ORDER — MENTHOL 3 MG MT LOZG: 1.0000 | LOZENGE | OROMUCOSAL | Status: DC | PRN

## 2017-01-01 MED ORDER — DEXAMETHASONE SODIUM PHOSPHATE 10 MG/ML IJ SOLN
INTRAMUSCULAR | Status: DC | PRN
  Administered 2017-01-01: 5 mg via INTRAVENOUS

## 2017-01-01 MED ORDER — PANTOPRAZOLE SODIUM 40 MG IV SOLR
40.0000 mg | Freq: Every day | INTRAVENOUS | Status: DC
  Administered 2017-01-01: 40 mg via INTRAVENOUS
  Filled 2017-01-01 (×2): qty 40

## 2017-01-01 MED ORDER — GABAPENTIN 300 MG PO CAPS
300.0000 mg | ORAL_CAPSULE | Freq: Every day | ORAL | Status: DC
  Administered 2017-01-01: 300 mg via ORAL
  Filled 2017-01-01: qty 1

## 2017-01-01 MED ORDER — CARVEDILOL 6.25 MG PO TABS
12.5000 mg | ORAL_TABLET | Freq: Two times a day (BID) | ORAL | Status: DC
  Administered 2017-01-01 – 2017-01-02 (×2): 12.5 mg via ORAL
  Filled 2017-01-01 (×2): qty 2

## 2017-01-01 MED ORDER — METHOCARBAMOL 1000 MG/10ML IJ SOLN
500.0000 mg | Freq: Four times a day (QID) | INTRAMUSCULAR | Status: DC | PRN
  Filled 2017-01-01: qty 5

## 2017-01-01 MED ORDER — GABAPENTIN 600 MG PO TABS
600.0000 mg | ORAL_TABLET | Freq: Every day | ORAL | Status: DC
  Administered 2017-01-01: 600 mg via ORAL
  Filled 2017-01-01: qty 1

## 2017-01-01 MED ORDER — KETOROLAC TROMETHAMINE 15 MG/ML IJ SOLN
15.0000 mg | Freq: Four times a day (QID) | INTRAMUSCULAR | Status: AC
  Administered 2017-01-01 – 2017-01-02 (×4): 15 mg via INTRAVENOUS
  Filled 2017-01-01 (×3): qty 1

## 2017-01-01 MED ORDER — HYDROCORTISONE 1 % EX OINT
1.0000 "application " | TOPICAL_OINTMENT | Freq: Four times a day (QID) | CUTANEOUS | Status: DC | PRN
  Filled 2017-01-01: qty 28.35

## 2017-01-01 MED ORDER — BISACODYL 10 MG RE SUPP: 10.0000 mg | Freq: Every day | RECTAL | Status: DC | PRN

## 2017-01-01 MED ORDER — ACETAMINOPHEN 650 MG RE SUPP
650.0000 mg | RECTAL | Status: DC | PRN
Start: 2017-01-01 — End: 2017-01-02

## 2017-01-01 MED ORDER — THROMBIN 5000 UNITS EX SOLR
CUTANEOUS | Status: AC
  Filled 2017-01-01: qty 10000

## 2017-01-01 MED ORDER — PROMETHAZINE HCL 25 MG/ML IJ SOLN: 6.2500 mg | INTRAMUSCULAR | Status: DC | PRN

## 2017-01-01 MED ORDER — CEFAZOLIN SODIUM-DEXTROSE 2-4 GM/100ML-% IV SOLN
2.0000 g | Freq: Three times a day (TID) | INTRAVENOUS | Status: AC
  Administered 2017-01-01 (×2): 2 g via INTRAVENOUS
  Filled 2017-01-01 (×2): qty 100

## 2017-01-01 MED ORDER — FENTANYL CITRATE (PF) 250 MCG/5ML IJ SOLN
INTRAMUSCULAR | Status: AC
  Filled 2017-01-01: qty 5

## 2017-01-01 MED ORDER — VITAMIN D (ERGOCALCIFEROL) 1.25 MG (50000 UNIT) PO CAPS
50000.0000 [IU] | ORAL_CAPSULE | ORAL | Status: DC
Start: 2017-01-01 — End: 2017-01-02
  Administered 2017-01-01: 50000 [IU] via ORAL
  Filled 2017-01-01: qty 1

## 2017-01-01 MED ORDER — ROCURONIUM BROMIDE 10 MG/ML (PF) SYRINGE
PREFILLED_SYRINGE | INTRAVENOUS | Status: DC | PRN
  Administered 2017-01-01: 20 mg via INTRAVENOUS
  Administered 2017-01-01: 50 mg via INTRAVENOUS
  Administered 2017-01-01: 20 mg via INTRAVENOUS
  Administered 2017-01-01: 10 mg via INTRAVENOUS

## 2017-01-01 MED ORDER — PHENYLEPHRINE HCL 10 MG/ML IJ SOLN
INTRAVENOUS | Status: DC | PRN
  Administered 2017-01-01: 10 ug/min via INTRAVENOUS

## 2017-01-01 MED ORDER — THROMBIN 20000 UNITS EX SOLR
CUTANEOUS | Status: AC
  Filled 2017-01-01: qty 20000

## 2017-01-01 MED ORDER — PROPOFOL 10 MG/ML IV BOLUS
INTRAVENOUS | Status: AC
  Filled 2017-01-01: qty 40

## 2017-01-01 MED ORDER — THROMBIN 5000 UNITS EX SOLR
CUTANEOUS | Status: DC | PRN
  Administered 2017-01-01: 10:00:00 via TOPICAL

## 2017-01-01 MED ORDER — INSULIN ASPART 100 UNIT/ML ~~LOC~~ SOLN
SUBCUTANEOUS | Status: AC
  Administered 2017-01-01: 0.5 [IU] via SUBCUTANEOUS
  Filled 2017-01-01: qty 1

## 2017-01-01 MED ORDER — SUGAMMADEX SODIUM 200 MG/2ML IV SOLN
INTRAVENOUS | Status: DC | PRN
  Administered 2017-01-01: 200 mg via INTRAVENOUS

## 2017-01-01 MED ORDER — MIDAZOLAM HCL 5 MG/5ML IJ SOLN
INTRAMUSCULAR | Status: DC | PRN
  Administered 2017-01-01: 2 mg via INTRAVENOUS

## 2017-01-01 MED ORDER — ACETAMINOPHEN 325 MG PO TABS: 650.0000 mg | ORAL_TABLET | ORAL | Status: DC | PRN

## 2017-01-01 MED ORDER — FENTANYL CITRATE (PF) 100 MCG/2ML IJ SOLN
INTRAMUSCULAR | Status: DC | PRN
  Administered 2017-01-01: 50 ug via INTRAVENOUS
  Administered 2017-01-01: 100 ug via INTRAVENOUS
  Administered 2017-01-01 (×3): 50 ug via INTRAVENOUS

## 2017-01-01 MED ORDER — PROPOFOL 10 MG/ML IV BOLUS
INTRAVENOUS | Status: DC | PRN
  Administered 2017-01-01: 50 mg via INTRAVENOUS
  Administered 2017-01-01: 150 mg via INTRAVENOUS

## 2017-01-01 MED ORDER — OXYCODONE HCL 5 MG PO TABS
ORAL_TABLET | ORAL | Status: AC
  Filled 2017-01-01: qty 2

## 2017-01-01 MED ORDER — TIZANIDINE HCL 4 MG PO TABS: 4.0000 mg | ORAL_TABLET | Freq: Every evening | ORAL | Status: DC | PRN

## 2017-01-01 MED ORDER — CEFAZOLIN SODIUM-DEXTROSE 2-4 GM/100ML-% IV SOLN
INTRAVENOUS | Status: AC
  Filled 2017-01-01: qty 100

## 2017-01-01 MED ORDER — INSULIN ASPART 100 UNIT/ML ~~LOC~~ SOLN
0.0000 [IU] | Freq: Three times a day (TID) | SUBCUTANEOUS | Status: DC
  Administered 2017-01-01: 8 [IU] via SUBCUTANEOUS
  Administered 2017-01-01: 0.5 [IU] via SUBCUTANEOUS
  Administered 2017-01-02: 2 [IU] via SUBCUTANEOUS

## 2017-01-01 MED ORDER — MEPERIDINE HCL 25 MG/ML IJ SOLN: 6.2500 mg | INTRAMUSCULAR | Status: DC | PRN

## 2017-01-01 MED ORDER — MIDAZOLAM HCL 2 MG/2ML IJ SOLN
INTRAMUSCULAR | Status: AC
  Filled 2017-01-01: qty 2

## 2017-01-01 MED ORDER — PHENOL 1.4 % MT LIQD: 1.0000 | OROMUCOSAL | Status: DC | PRN

## 2017-01-01 MED ORDER — LISINOPRIL 20 MG PO TABS
20.0000 mg | ORAL_TABLET | Freq: Every evening | ORAL | Status: DC
  Administered 2017-01-01: 20 mg via ORAL
  Filled 2017-01-01: qty 1

## 2017-01-01 MED ORDER — HYDROMORPHONE HCL 1 MG/ML IJ SOLN: 0.2500 mg | INTRAMUSCULAR | Status: DC | PRN

## 2017-01-01 MED ORDER — METHOCARBAMOL 500 MG PO TABS
ORAL_TABLET | ORAL | Status: AC
  Filled 2017-01-01: qty 1

## 2017-01-01 MED ORDER — DIPHENHYDRAMINE HCL 25 MG PO CAPS: 25.0000 mg | ORAL_CAPSULE | Freq: Every evening | ORAL | Status: DC | PRN

## 2017-01-01 SURGICAL SUPPLY — 78 items
BAG DECANTER FOR FLEXI CONT (MISCELLANEOUS) ×3 IMPLANT
BASKET BONE COLLECTION (BASKET) ×3 IMPLANT
BENZOIN TINCTURE PRP APPL 2/3 (GAUZE/BANDAGES/DRESSINGS) IMPLANT
BIT DRILL 3.5 POWEREASE (BIT) ×2 IMPLANT
BIT DRILL 3.5MM POWEREASE (BIT) ×1
BLADE CLIPPER SURG (BLADE) IMPLANT
BLADE SURG 11 STRL SS (BLADE) ×3 IMPLANT
BUR MATCHSTICK NEURO 3.0 LAGG (BURR) ×3 IMPLANT
BUR PRECISION FLUTE 5.0 (BURR) ×3 IMPLANT
CANISTER SUCT 3000ML PPV (MISCELLANEOUS) ×3 IMPLANT
CARTRIDGE OIL MAESTRO DRILL (MISCELLANEOUS) ×1 IMPLANT
CLOSURE WOUND 1/2 X4 (GAUZE/BANDAGES/DRESSINGS)
CONT SPEC 4OZ CLIKSEAL STRL BL (MISCELLANEOUS) ×3 IMPLANT
COVER BACK TABLE 60X90IN (DRAPES) ×3 IMPLANT
DECANTER SPIKE VIAL GLASS SM (MISCELLANEOUS) ×6 IMPLANT
DERMABOND ADVANCED (GAUZE/BANDAGES/DRESSINGS) ×2
DERMABOND ADVANCED .7 DNX12 (GAUZE/BANDAGES/DRESSINGS) ×1 IMPLANT
DIFFUSER DRILL AIR PNEUMATIC (MISCELLANEOUS) ×3 IMPLANT
DRAPE C-ARM 42X72 X-RAY (DRAPES) ×3 IMPLANT
DRAPE C-ARMOR (DRAPES) ×3 IMPLANT
DRAPE LAPAROTOMY 100X72X124 (DRAPES) ×3 IMPLANT
DRAPE POUCH INSTRU U-SHP 10X18 (DRAPES) ×3 IMPLANT
DRAPE SURG 17X23 STRL (DRAPES) ×3 IMPLANT
DRSG OPSITE POSTOP 4X6 (GAUZE/BANDAGES/DRESSINGS) ×3 IMPLANT
DURAPREP 26ML APPLICATOR (WOUND CARE) ×3 IMPLANT
ELECT REM PT RETURN 9FT ADLT (ELECTROSURGICAL) ×3
ELECTRODE REM PT RTRN 9FT ADLT (ELECTROSURGICAL) ×1 IMPLANT
GAUZE SPONGE 4X4 12PLY STRL (GAUZE/BANDAGES/DRESSINGS) IMPLANT
GAUZE SPONGE 4X4 16PLY XRAY LF (GAUZE/BANDAGES/DRESSINGS) ×3 IMPLANT
GLOVE BIO SURGEON STRL SZ7.5 (GLOVE) IMPLANT
GLOVE BIOGEL PI IND STRL 6.5 (GLOVE) ×2 IMPLANT
GLOVE BIOGEL PI IND STRL 7.5 (GLOVE) ×3 IMPLANT
GLOVE BIOGEL PI IND STRL 8 (GLOVE) ×4 IMPLANT
GLOVE BIOGEL PI INDICATOR 6.5 (GLOVE) ×4
GLOVE BIOGEL PI INDICATOR 7.5 (GLOVE) ×6
GLOVE BIOGEL PI INDICATOR 8 (GLOVE) ×8
GLOVE ECLIPSE 7.0 STRL STRAW (GLOVE) ×9 IMPLANT
GLOVE ECLIPSE 7.5 STRL STRAW (GLOVE) ×15 IMPLANT
GLOVE ECLIPSE 9.0 STRL (GLOVE) ×3 IMPLANT
GLOVE EXAM NITRILE LRG STRL (GLOVE) IMPLANT
GLOVE EXAM NITRILE XL STR (GLOVE) IMPLANT
GLOVE EXAM NITRILE XS STR PU (GLOVE) IMPLANT
GLOVE SURG SS PI 6.0 STRL IVOR (GLOVE) ×15 IMPLANT
GOWN STRL REUS W/ TWL LRG LVL3 (GOWN DISPOSABLE) ×6 IMPLANT
GOWN STRL REUS W/ TWL XL LVL3 (GOWN DISPOSABLE) ×1 IMPLANT
GOWN STRL REUS W/TWL 2XL LVL3 (GOWN DISPOSABLE) ×9 IMPLANT
GOWN STRL REUS W/TWL LRG LVL3 (GOWN DISPOSABLE) ×12
GOWN STRL REUS W/TWL XL LVL3 (GOWN DISPOSABLE) ×2
HEMOSTAT POWDER KIT SURGIFOAM (HEMOSTASIS) ×3 IMPLANT
KIT BASIN OR (CUSTOM PROCEDURE TRAY) ×3 IMPLANT
KIT INFUSE X SMALL 1.4CC (Orthopedic Implant) ×3 IMPLANT
KIT POSITION SURG JACKSON T1 (MISCELLANEOUS) ×3 IMPLANT
KIT ROOM TURNOVER OR (KITS) ×3 IMPLANT
MILL MEDIUM DISP (BLADE) ×3 IMPLANT
NEEDLE HYPO 18GX1.5 BLUNT FILL (NEEDLE) IMPLANT
NEEDLE HYPO 22GX1.5 SAFETY (NEEDLE) ×3 IMPLANT
NEEDLE SPNL 18GX3.5 QUINCKE PK (NEEDLE) ×3 IMPLANT
NS IRRIG 1000ML POUR BTL (IV SOLUTION) ×3 IMPLANT
OIL CARTRIDGE MAESTRO DRILL (MISCELLANEOUS) ×3
PACK LAMINECTOMY NEURO (CUSTOM PROCEDURE TRAY) ×3 IMPLANT
PAD ARMBOARD 7.5X6 YLW CONV (MISCELLANEOUS) ×9 IMPLANT
ROD COBALT 47.5X35 (Rod) ×6 IMPLANT
SCREW 5.5X30MM (Screw) ×6 IMPLANT
SCREW 5.5X35MM (Screw) ×4 IMPLANT
SCREW BN 35X5.5XMA NS SPNE (Screw) ×2 IMPLANT
SCREW SET SOLERA (Screw) ×8 IMPLANT
SCREW SET SOLERA TI (Screw) ×4 IMPLANT
SPONGE LAP 4X18 X RAY DECT (DISPOSABLE) IMPLANT
SPONGE SURGIFOAM ABS GEL 100 (HEMOSTASIS) ×3 IMPLANT
STRIP CLOSURE SKIN 1/2X4 (GAUZE/BANDAGES/DRESSINGS) IMPLANT
SUT VIC AB 0 CT1 18XCR BRD8 (SUTURE) ×2 IMPLANT
SUT VIC AB 0 CT1 8-18 (SUTURE) ×4
SUT VICRYL 3-0 RB1 18 ABS (SUTURE) ×6 IMPLANT
SYR 3ML LL SCALE MARK (SYRINGE) IMPLANT
TOWEL GREEN STERILE (TOWEL DISPOSABLE) ×3 IMPLANT
TOWEL GREEN STERILE FF (TOWEL DISPOSABLE) ×3 IMPLANT
TRAY FOLEY W/METER SILVER 16FR (SET/KITS/TRAYS/PACK) ×3 IMPLANT
WATER STERILE IRR 1000ML POUR (IV SOLUTION) ×3 IMPLANT

## 2017-01-01 NOTE — Progress Notes (Signed)
Orthopedic Tech Progress Note Patient Details:  Christy Brewer 01/11/1961 [id number]  Patient ID: Christy Brewer, female   DOB: 08/15/1960, 56 y.o.   MRN: [id number]   Hildred Priest 01/01/2017, 2:02 PM Called in bio-tech brace order; spoke with [facility]

## 2017-01-01 NOTE — Anesthesia Preprocedure Evaluation (Addendum)
Anesthesia Evaluation  Patient identified by MRN, date of birth, ID band Patient awake    Reviewed: Allergy & Precautions, NPO status , Patient's Chart, lab work & pertinent test results  History of Anesthesia Complications Negative for: history of anesthetic complications  Airway Mallampati: I  TM Distance: >3 FB Neck ROM: Full    Dental  (+) Teeth Intact, Dental Advisory Given   Pulmonary neg pulmonary ROS,    breath sounds clear to auscultation       Cardiovascular hypertension, Pt. on medications and Pt. on home beta blockers + dysrhythmias (-) Valvular Problems/Murmurs Rhythm:Regular Rate:Normal     Neuro/Psych  Headaches, Chronic back pain    GI/Hepatic Neg liver ROS, GERD  Controlled,  Endo/Other  diabetes (glu 196), Oral Hypoglycemic AgentsMorbid obesity  Renal/GU negative Renal ROS     Musculoskeletal  (+) Arthritis ,   Abdominal (+) + obese,   Peds  Hematology  (+) Sickle cell trait ,   Anesthesia Other Findings   Reproductive/Obstetrics                          Anesthesia Physical Anesthesia Plan  ASA: II  Anesthesia Plan: General   Post-op Pain Management:    Induction: Intravenous  PONV Risk Score and Plan: 4 or greater and Ondansetron, Dexamethasone, Midazolam, Scopolamine patch - Pre-op and Treatment may vary due to age or medical condition  Airway Management Planned: Oral ETT  Additional Equipment:   Intra-op Plan:   Post-operative Plan: Extubation in OR  Informed Consent: I have reviewed the patients History and Physical, chart, labs and discussed the procedure including the risks, benefits and alternatives for the proposed anesthesia with the patient or authorized representative who has indicated his/her understanding and acceptance.   Dental advisory given  Plan Discussed with: CRNA and Surgeon  Anesthesia Plan Comments: (Plan routine monitors, GETA)        Anesthesia Quick Evaluation

## 2017-01-01 NOTE — H&P (Signed)
Chief Complaint   No chief complaint on file.   History of Present Illness  Christy Brewer presented for initial consultation of her low back pain. She reports a longstanding history of chronic low back pain that required surgery in 2003 by a Dr in [place], [place]. She is unsure what was done, but knows that it was L5 that was operated on. Since then she has had chronic back pain, but in March 2018, she was required to wear a boot on her left foot due to bone spurs and since then has been having right lower back pain with radiation down her right leg to her toes. She reports pain is a 10/10. She endorses symptoms consistent with neurogenic claudication. Pain is exacerbated by walking, standing, sitting in any type of movement. She has not found any relieving factors. She has been on oxycodone, muscle relaxers, anti-inflammatories and gabapentin with minimal relief. She has been to PT without improvement. She denies any bowel or bladder dysfunction.   Past Medical History   Past Medical History:  Diagnosis Date  . Arthritis   . Complication of anesthesia 06/2015   bp dropped during breast surgery   . Diabetes mellitus without complication ([place])   . Dysrhythmia 2013   irregular heartbeat-stress done rx   . GERD (gastroesophageal reflux disease)   . Headache    migraines hx  . Heart murmur   . Hypertension   . Papilloma of breast   . Sickle cell trait [facility])     Past Surgical History   Past Surgical History:  Procedure Laterality Date  . ABDOMINAL HYSTERECTOMY    . BACK SURGERY    . BREAST LUMPECTOMY     BILAT  . CHOLECYSTECTOMY    . TONSILLECTOMY    . TUBAL LIGATION      Social History   Social History  Substance Use Topics  . Smoking status: Never Smoker  . Smokeless tobacco: Never Used  . Alcohol use No    Medications   Prior to Admission medications   Medication Sig Start Date End Date Taking? Authorizing Provider  carvedilol (COREG) 12.5 MG tablet Take 12.5 mg  by mouth 2 (two) times daily with a meal.   Yes [provider]  Cyanocobalamin (VITAMIN B12 PO) Take 5,000 mcg by mouth daily. Liquid   Yes [provider]  diphenhydrAMINE (BENADRYL) 25 mg capsule Take 25 mg by mouth at bedtime as needed for itching.    Yes [provider]  Diphenhydramine-APAP, sleep, (EXCEDRIN PM PO) Take 1 tablet by mouth at bedtime as needed (sleep).   Yes [provider]  FIASP FLEXTOUCH 100 UNIT/ML FlexPen Inject 12 Units into the skin 3 (three) times daily before meals.  08/29/16  Yes [provider]  gabapentin (NEURONTIN) 300 MG capsule Take 300 mg by mouth at bedtime. Takes 300 mg along with 600 mg to equal 900 mg at bedtime 07/15/16  Yes [provider]  gabapentin (NEURONTIN) 600 MG tablet Take 600 mg by mouth at bedtime. Takes 600 mg along with 300 mg to equal 900 mg at bedtime 08/07/16  Yes [provider]  lisinopril (PRINIVIL,ZESTRIL) 20 MG tablet Take 20 mg by mouth every evening. 07/24/16  Yes [provider]  metFORMIN (GLUCOPHAGE) 500 MG tablet Take 1,000 mg by mouth 2 (two) times daily with a meal.   Yes [provider]  Probiotic Product (PROBIOTIC PO) Take 1 tablet by mouth daily.   Yes [provider]  simvastatin (ZOCOR)  20 MG tablet Take 20 mg by mouth every evening. 08/10/16  Yes [provider]  tiZANidine (ZANAFLEX) 4 MG capsule Take 4 mg by mouth at bedtime as needed for muscle pain. 11/08/16  Yes [provider]  TRESIBA FLEXTOUCH 100 UNIT/ML SOPN FlexTouch Pen Inject 60 Units into the skin at bedtime. 08/22/16  Yes [provider]  Vitamin D, Ergocalciferol, (DRISDOL) 50000 units CAPS capsule TAKE ONE CAPSULE BY MOUTH THREE TIMES A WEEK 08/19/16  Yes [provider]  HYDROCORTISONE IN ABSORBASE 1 % ointment Apply 1 application topically 4 (four) times daily as needed (on feet). After pedicures for tingling sensations 06/29/16   [provider]  mometasone (NASONEX) 50 MCG/ACT nasal spray Place 2 sprays into the nose daily as needed (allergies).    [provider]    Allergies  No Known Allergies  Review of Systems  ROS  Neurologic Exam  Awake, alert, oriented Memory and concentration grossly intact Speech fluent, appropriate CN grossly intact Motor exam: Upper Extremities Deltoid Bicep Tricep Grip  Right 5/5 5/5 5/5 5/5  Left 5/5 5/5 5/5 5/5   Lower Extremities IP Quad PF DF EHL  Right 5/5 5/5 5/5 5/5 5/5  Left 5/5 5/5 5/5 5/5 5/5   Sensation grossly intact to LT  Imaging  MRI of the lumbar spine was again reviewed, demonstrating primary pathology at L5-S1, where there is a broad-based disc bulge, and bilateral facet arthropathy. This includes hypertrophy of the articulating process on the right at L5-S1 with resultant foraminal stenosis.   Impression  56 year old woman with back and right-sided leg pain who underwent previous decompression at L5-S1. She has failed reasonable conservative treatments including medical therapy and a course of physical therapy. In addition, based on her MRI, I do believe that adequate surgical decompression of the foramina on the right will require radical facetectomy necessitating fusion.   Plan  We will plan on proceeding with PLIF at L5-S1   I reviewed the MRI and x-ray findings with the patient and family in the office. Treatment options were discussed including continued conservative treatment versus surgical decompression and fusion. I explained to the patient the details of the procedure, as well as the risks which include but are not limited to nerve root injury leading to leg or foot weakness/numbness and/or bowel and bladder dysfunction, CSF leak, bleeding, and infection. Possible outcomes of surgery were also discussed including the possibility of persistence or worsening of pain symptoms and the possiblity of accelerated adjacent level degeneration. The  general risks of anesthesia were also reviewed including heart attack, stroke, and DVT/PE.   The patient understood our discussion and is willing to proceed with surgical decompression and fusion. All questions were answered.

## 2017-01-01 NOTE — Transfer of Care (Signed)
Immediate Anesthesia Transfer of Care Note  Patient: Christy Brewer  Procedure(s) Performed: POSTERIOR LUMBAR FUSION Lumbar five-Sacral one (N/A Back)  Patient Location: PACU  Anesthesia Type:General  Level of Consciousness: alert , oriented, drowsy and patient cooperative  Airway & Oxygen Therapy: Patient Spontanous Breathing and Patient connected to nasal cannula oxygen  Post-op Assessment: Report given to RN and Post -op Vital signs reviewed and stable  Post vital signs: Reviewed and stable  Last Vitals:  Vitals:   01/01/17 0556  BP: (!) 148/74  Pulse: 81  Resp: 20  Temp: 36.8 C  SpO2: 97%    Last Pain:  Vitals:   01/01/17 0556  TempSrc: Oral         Complications: No apparent anesthesia complications

## 2017-01-01 NOTE — Anesthesia Procedure Notes (Signed)
Procedure Name: Intubation Date/Time: 01/01/2017 8:25 AM Performed by: Everlean Cherry A Pre-anesthesia Checklist: Patient identified, Emergency Drugs available, Patient being monitored and Suction available Patient Re-evaluated:Patient Re-evaluated prior to induction Oxygen Delivery Method: Circle system utilized Preoxygenation: Pre-oxygenation with 100% oxygen Induction Type: IV induction Ventilation: Mask ventilation without difficulty Laryngoscope Size: Miller and 2 Grade View: Grade II Tube type: Oral Tube size: 7.0 mm Number of attempts: 1 Airway Equipment and Method: Stylet Placement Confirmation: ETT inserted through vocal cords under direct vision,  positive ETCO2 and breath sounds checked- equal and bilateral Secured at: 22 cm Tube secured with: Tape Dental Injury: Teeth and Oropharynx as per pre-operative assessment

## 2017-01-01 NOTE — Evaluation (Addendum)
Physical Therapy Evaluation Patient Details Name: Christy Brewer MRN: [id number] DOB: [birth year] Today's Date: 01/01/2017   History of Present Illness  Pt is 56 yo female s/p PLIF L5-S1. PMH: HTN, DM, bilat LE neuropathy. PSH: back surgery, breast lumpectomy.  Clinical Impression  Patient is s/p above surgery resulting in the deficits listed below (see PT Problem List). Pt sleepy however appropriate and participated. Patient will benefit from skilled PT to increase their independence and safety with mobility (while adhering to their precautions) to allow discharge to the venue listed below.     Follow Up Recommendations No PT follow up;Supervision - Intermittent, would benefit from 24/7 initially    Equipment Recommendations  Rolling walker with 5" wheels (may not need after treatment tomorrow)    Recommendations for Other Services       Precautions / Restrictions Precautions Precautions: Back Precaution Booklet Issued: Yes (comment) Precaution Comments: pt sleepy from surgery but with verbal confirmation Required Braces or Orthoses: Spinal Brace Spinal Brace: Lumbar corset (awaiting brace) Restrictions Weight Bearing Restrictions: No Other Position/Activity Restrictions: orders that pt can amb to bathroom without brace      Mobility  Bed Mobility Overal bed mobility: Needs Assistance Bed Mobility: Sidelying to Sit   Sidelying to sit: Min assist       General bed mobility comments: pt in sidelying upon arrival, v/c's for technique, minA for trunk elevation, increased time  Transfers Overall transfer level: Needs assistance Equipment used: None Transfers: Sit to/from Stand Sit to Stand: Min assist         General transfer comment: increased time, v/c's to minimize trunk flexion and to push up from bed. minA to steady pt due to first time up  Ambulation/Gait Ambulation/Gait assistance: Min assist Ambulation Distance (Feet): 15 Feet Assistive device: 1 person hand  held assist Gait Pattern/deviations: Decreased stride length;Step-to pattern Gait velocity: slow Gait velocity interpretation: Below normal speed for age/gender General Gait Details: decreased step height, slow, gaurded, increased pain, held onto IV pole with L hand  Stairs            Wheelchair Mobility    Modified Rankin (Stroke Patients Only)       Balance Overall balance assessment: Needs assistance Sitting-balance support: Feet supported;No upper extremity supported Sitting balance-Leahy Scale: Fair     Standing balance support: Single extremity supported Standing balance-Leahy Scale: Fair                               Pertinent Vitals/Pain Pain Assessment: 0-10 Pain Score: 9  Pain Location: surgical site Pain Descriptors / Indicators: Sore Pain Intervention(s): Monitored during session    Home Living Family/patient expects to be discharged to:: Private residence Living Arrangements: Spouse/significant other Available Help at Discharge: Family;Available PRN/intermittently Type of Home: House Home Access: Stairs to enter Entrance Stairs-Rails: None Entrance Stairs-Number of Steps: 1 Home Layout: One level Home Equipment: Bedside commode;Shower seat      Prior Function Level of Independence: Independent               Hand Dominance        Extremity/Trunk Assessment   Upper Extremity Assessment Upper Extremity Assessment: Generalized weakness    Lower Extremity Assessment Lower Extremity Assessment: Generalized weakness    Cervical / Trunk Assessment Cervical / Trunk Assessment: Other exceptions Cervical / Trunk Exceptions: back surgery  Communication   Communication: No difficulties  Cognition Arousal/Alertness: Awake/alert (but sleepy from surgery, kept  eyes closed) Behavior During Therapy: WFL for tasks assessed/performed Overall Cognitive Status: Within Functional Limits for tasks assessed                                  General Comments: kept eyes closed but easily opened them and was appropriately responsive      General Comments General comments (skin integrity, edema, etc.): surgical dressing intact    Exercises     Assessment/Plan    PT Assessment Patient needs continued PT services  PT Problem List Decreased strength;Decreased activity tolerance;Decreased balance;Decreased mobility;Decreased knowledge of use of DME;Pain       PT Treatment Interventions DME instruction;Gait training;Stair training;Functional mobility training;Therapeutic activities;Therapeutic exercise;Balance training    PT Goals (Current goals can be found in the Care Plan section)  Acute Rehab PT Goals Patient Stated Goal: didn't state PT Goal Formulation: With patient Time For Goal Achievement: 01/08/17 Potential to Achieve Goals: Good    Frequency Min 5X/week   Barriers to discharge Decreased caregiver support will be home alone during the day    Co-evaluation               AM-PAC PT "6 Clicks" Daily Activity  Outcome Measure Difficulty turning over in bed (including adjusting bedclothes, sheets and blankets)?: A Lot Difficulty moving from lying on back to sitting on the side of the bed? : A Lot Difficulty sitting down on and standing up from a chair with arms (e.g., wheelchair, bedside commode, etc,.)?: A Lot Help needed moving to and from a bed to chair (including a wheelchair)?: A Lot Help needed walking in hospital room?: A Lot Help needed climbing 3-5 steps with a railing? : A Lot 6 Click Score: 12    End of Session   Activity Tolerance: Patient tolerated treatment well Patient left: in chair;with call bell/phone within reach (instructed pt to only sit up for 30 min) Nurse Communication: Mobility status PT Visit Diagnosis: Pain;Difficulty in walking, not elsewhere classified (R26.2) Pain - part of body:  (back)    Time: 1430-1450 PT Time Calculation (min) (ACUTE ONLY): 20  min   Charges:   PT Evaluation $PT Eval Moderate Complexity: 1 Mod     PT G CodesKittie Plater, PT, DPT Pager #: [phone] Office #: [phone]   Kadeisha Betsch M Cloyde Oregel 01/01/2017, 3:04 PM

## 2017-01-01 NOTE — Op Note (Signed)
PREOP DIAGNOSIS:  1. Recurrent lumbar disc herniation 2. Lumbar spondylosis with radiculopathy  POSTOP DIAGNOSIS: Same  PROCEDURE: 1. L5 laminectomy with facetectomy for decompression of exiting nerve roots 2. Posterior non-segmental instrumentation using cortical pedicle screws at L5 - S1 3. Posterolateral arthrodesis, L5-S1 4. Use of locally harvested bone autograft 5. Use of non-structural bone allograft - BMP  SURGEON: Dr. Consuella Lose, MD  ASSISTANT: Dr. Charlie Pitter, MD  ANESTHESIA: General Endotracheal  EBL: 200cc  SPECIMENS: None  DRAINS: None  COMPLICATIONS: None immediate  CONDITION: Hemodynamically stable to PACU  HISTORY: Christy Brewer is a 56 y.o. female who has been followed in the outpatient clinic with back and leg pain related to spondylosis at L5-S1. She had previously undergone laminectomy at this level many years ago. She attempted multiple conservative treatments and ultimately elected to proceed with surgical decompression and fusion. Risks and benefits were reviewed and consent was obtained.  PROCEDURE IN DETAIL: After informed consent was obtained and witnessed, the patient was brought to the operating room. After induction of general anesthesia, the patient was positioned on the operative table in the prone position. All pressure points were meticulously padded. Incision was then marked out and prepped and draped in the usual sterile fashion.  After timeout was conducted, skin was infiltrated with local anesthetic. Previous skin incision was then opened sharply and extended, and Bovie electrocautery was used to dissect the subcutaneous tissue until the lumbodorsal fascia was identified and incised. The muscle was then elevated in the subperiosteal plane and the L5 lamina and facet complexes were identified. Self-retaining retractors were then placed.  Complete L5 laminectomy with facetectomy was completed using a combination of Kerrison rongeurs and  a high-speed drill. Good decompression of the exiting and traversing roots was confirmed. Significant hypertrophy was noted of the superior articulating process on the right at S1,compressing the exiting right L5 nerve root. The disc space was also noted to be significantly collapsed, and calcified. The ventral epidural space was dissected away from the posterior longitudinal ligament,andosteotomes were used to remove the calcified disc that was compressing the thecal sac and right S1 root. The disc space was significantly collapsed, and it became apparent that it would be very difficult to place interbody devices. I therefore elected to fo discectomy and interbody fusion.  At this point entry points for cortical pedicle screws at L5 and S1 were drilled and tapped to 5.5 x 35 mm. Screws were then placed, connected with a 109mm rod, and setscrews were placed and final tightened. Final AP and lateral fluoroscopic images confirmed good position.  He remaining lateral facet complex and transverse process was then identified and decorticated with high-speed drill. On graft collected during decompression was morcellized, and after BMP was placed in the lateral gutter, the autograft was placed to facilitate posterior lateral fusion.  The wound was then irrigated with copious amounts of antibiotic saline, then closed in standard fashion using a combination of interrupted 0 and 3-0 Vicryl stitches in the muscular, fascial, and subcutaneous layers. Skin was then closed using standard Dermabond. Sterile dressing was then applied. The patient was then transferred to the stretcher, extubated, and taken to the postanesthesia care unit in stable hemodynamic condition.  At the end of the case all sponge, needle, cottonoid, and instrument counts were correct.

## 2017-01-01 NOTE — Anesthesia Postprocedure Evaluation (Signed)
Anesthesia Post Note  Patient: Christy Brewer  Procedure(s) Performed: POSTERIOR LUMBAR FUSION Lumbar five-Sacral one (N/A Back)     Patient location during evaluation: PACU Anesthesia Type: General Level of consciousness: sedated, patient cooperative and oriented Pain management: pain level controlled Vital Signs Assessment: post-procedure vital signs reviewed and stable Respiratory status: spontaneous breathing, nonlabored ventilation, respiratory function stable and patient connected to nasal cannula oxygen Cardiovascular status: blood pressure returned to baseline and stable Postop Assessment: no apparent nausea or vomiting Anesthetic complications: no    Last Vitals:  Vitals:   01/01/17 1325 01/01/17 1400  BP: 135/68 (!) 160/94  Pulse: 90 87  Resp: 14 18  Temp: 36.6 C 36.8 C  SpO2: 97% 95%    Last Pain:  Vitals:   01/01/17 1820  TempSrc:   PainSc: 9                  Shelagh Rayman,E. Magnolia Mattila

## 2017-01-02 LAB — BASIC METABOLIC PANEL
ANION GAP: 11 (ref 5–15)
BUN: 12 mg/dL (ref 6–20)
CALCIUM: 9.2 mg/dL (ref 8.9–10.3)
CO2: 28 mmol/L (ref 22–32)
Chloride: 99 mmol/L — ABNORMAL LOW (ref 101–111)
Creatinine, Ser: 1.21 mg/dL — ABNORMAL HIGH (ref 0.44–1.00)
GFR calc Af Amer: 58 mL/min — ABNORMAL LOW (ref >60)
GFR, EST NON AFRICAN AMERICAN: 50 mL/min — AB (ref >60)
GLUCOSE: 160 mg/dL — AB (ref 65–99)
POTASSIUM: 4.1 mmol/L (ref 3.5–5.1)
Sodium: 138 mmol/L (ref 135–145)

## 2017-01-02 LAB — CBC
HCT: 30.7 % — ABNORMAL LOW (ref 36.0–46.0)
Hemoglobin: 10 g/dL — ABNORMAL LOW (ref 12.0–15.0)
MCH: 26.3 pg (ref 26.0–34.0)
MCHC: 32.6 g/dL (ref 30.0–36.0)
MCV: 80.8 fL (ref 78.0–100.0)
PLATELETS: 179 10*3/uL (ref 150–400)
RBC: 3.8 MIL/uL — AB (ref 3.87–5.11)
RDW: 14.1 % (ref 11.5–15.5)
WBC: 14 10*3/uL — AB (ref 4.0–10.5)

## 2017-01-02 LAB — GLUCOSE, CAPILLARY: GLUCOSE-CAPILLARY: 131 mg/dL — AB (ref 65–99)

## 2017-01-02 MED ORDER — OXYCODONE-ACETAMINOPHEN 7.5-325 MG PO TABS: 1.0000 | ORAL_TABLET | ORAL | 0 refills | Status: AC | PRN

## 2017-01-02 MED ORDER — METHOCARBAMOL 500 MG PO TABS
500.0000 mg | ORAL_TABLET | Freq: Four times a day (QID) | ORAL | 2 refills | Status: AC | PRN
Start: 2017-01-02 — End: ?

## 2017-01-02 NOTE — Progress Notes (Signed)
Pt doing well. Pt and husband given D/C instructions with Rx's, verbal understanding was provided. Pt's IV was removed prior to D/C. Pt's incision is clean and dry with no sign of infection. Pt D/C'd home via wheelchair @ 1145 per MD order. Pt is stable @ D/C and has no other needs at this time. Holli Humbles, RN

## 2017-01-02 NOTE — Discharge Summary (Signed)
Physician Discharge Summary  Patient ID: Christy Brewer MRN: [id number] DOB/AGE: April 01, 1960 56 y.o.  Admit date: 01/01/2017 Discharge date: 01/02/2017  Admission Diagnoses:  Lumbar radiculopathy  Discharge Diagnoses:  Same Active Problems:   Other spondylosis with radiculopathy, lumbar region   Discharged Condition: Stable  Hospital Course:  Christy Brewer is a 56 y.o. female who was admitted for the below procedure. There were no post operative complications. At time of discharge, pain was well controlled, ambulating with Pt/OT, tolerating po, voiding normal. Ready for discharge.  Treatments: Surgery 1. L5 laminectomy with facetectomy for decompression of exiting nerve roots 2. Posterior non-segmental instrumentation using cortical pedicle screws at L5 - S1 3. Posterolateral arthrodesis, L5-S1 4. Use of locally harvested bone autograft 5. Use of non-structural bone allograft - BMP  Discharge Exam: Blood pressure 110/63, pulse 76, temperature 98.9 F (37.2 C), temperature source Oral, resp. rate 18, weight 88.9 kg (196 lb), SpO2 96 %. Awake, alert, oriented Speech fluent, appropriate CN grossly intact MAEW with good strength Small amount of dried blood on bandage. No active bleeding, wound is intact  Disposition: Home  Discharge Instructions    Call MD for:  difficulty breathing, headache or visual disturbances    Complete by:  As directed    Call MD for:  persistant dizziness or light-headedness    Complete by:  As directed    Call MD for:  redness, tenderness, or signs of infection (pain, swelling, redness, odor or green/yellow discharge around incision site)    Complete by:  As directed    Call MD for:  severe uncontrolled pain    Complete by:  As directed    Call MD for:  temperature >100.4    Complete by:  As directed    Diet general    Complete by:  As directed    Driving Restrictions    Complete by:  As directed    Do not drive until given clearance.   Increase activity slowly    Complete by:  As directed    Lifting restrictions    Complete by:  As directed    Do not lift anything >10lbs. Avoid bending and twisting in awkward positions. Avoid bending at the back.   May shower / Bathe    Complete by:  As directed    In 24 hours. Okay to wash wound with warm soapy water. Avoid scrubbing the wound. Pat dry.   Remove dressing in 24 hours    Complete by:  As directed      Allergies as of 01/02/2017   No Known Allergies     Medication List    STOP taking these medications   tiZANidine 4 MG capsule Commonly known as:  ZANAFLEX     TAKE these medications   carvedilol 12.5 MG tablet Commonly known as:  COREG Take 12.5 mg by mouth 2 (two) times daily with a meal.   diphenhydrAMINE 25 mg capsule Commonly known as:  BENADRYL Take 25 mg by mouth at bedtime as needed for itching.   EXCEDRIN PM PO Take 1 tablet by mouth at bedtime as needed (sleep).   FIASP FLEXTOUCH 100 UNIT/ML FlexPen Generic drug:  insulin aspart Inject 12 Units into the skin 3 (three) times daily before meals.   gabapentin 300 MG capsule Commonly known as:  NEURONTIN Take 300 mg by mouth at bedtime. Takes 300 mg along with 600 mg to equal 900 mg at bedtime   gabapentin 600 MG tablet Commonly known as:  NEURONTIN  Take 600 mg by mouth at bedtime. Takes 600 mg along with 300 mg to equal 900 mg at bedtime   HYDROCORTISONE IN ABSORBASE 1 % ointment Generic drug:  hydrocortisone Apply 1 application topically 4 (four) times daily as needed (on feet). After pedicures for tingling sensations   lisinopril 20 MG tablet Commonly known as:  PRINIVIL,ZESTRIL Take 20 mg by mouth every evening.   metFORMIN 500 MG tablet Commonly known as:  GLUCOPHAGE Take 1,000 mg by mouth 2 (two) times daily with a meal.   methocarbamol 500 MG tablet Commonly known as:  ROBAXIN Take 1 tablet (500 mg total) by mouth every 6 (six) hours as needed for muscle spasms.   mometasone  50 MCG/ACT nasal spray Commonly known as:  NASONEX Place 2 sprays into the nose daily as needed (allergies).   oxyCODONE-acetaminophen 7.5-325 MG tablet Commonly known as:  PERCOCET Take 1 tablet by mouth every 4 (four) hours as needed for severe pain.   PROBIOTIC PO Take 1 tablet by mouth daily.   simvastatin 20 MG tablet Commonly known as:  ZOCOR Take 20 mg by mouth every evening.   TRESIBA FLEXTOUCH 100 UNIT/ML Sopn FlexTouch Pen Generic drug:  insulin degludec Inject 60 Units into the skin at bedtime.   VITAMIN B12 PO Take 5,000 mcg by mouth daily. Liquid   Vitamin D (Ergocalciferol) 50000 units Caps capsule Commonly known as:  DRISDOL TAKE ONE CAPSULE BY MOUTH THREE TIMES A WEEK            Durable Medical Equipment        Start     Ordered   01/02/17 [phone]  For home use only DME Walker rolling  [facility])  Once    Question:  Patient needs a walker to treat with the following condition  Answer:  Gait instability   01/02/17 0951     Follow-up Information    Consuella Lose, MD. Schedule an appointment as soon as possible for a visit in 3 week(s).   Specialty:  Neurosurgery Contact information: [address] [place] [address] [phone]           Signed: Traci Sermon 01/02/2017, 9:52 AM

## 2017-01-02 NOTE — Progress Notes (Signed)
Physical Therapy Treatment Patient Details Name: Christy Brewer MRN: [id number] DOB: 06-Jan-1961 Today's Date: 01/02/2017    History of Present Illness Pt is 56 yo female s/p PLIF L5-S1. PMH: HTN, DM, bilat LE neuropathy. PSH: back surgery, breast lumpectomy.    PT Comments    Pt progressing towards physical therapy goals. Was able to perform transfers and ambulation with gross min guard to supervision for safety. Pt continues to require RW for support. Completed stair training this session, and pt was educated on precautions, car transfer, brace application/wearing schedule, and general safety with activity progression. Will continue to follow.    Follow Up Recommendations  No PT follow up;Supervision - Intermittent     Equipment Recommendations  Rolling walker with 5" wheels (may not need after treatment tomorrow)    Recommendations for Other Services       Precautions / Restrictions Precautions Precautions: Back Precaution Booklet Issued: Yes (comment) Precaution Comments: Reviewed back precaution handout with pt.  Required Braces or Orthoses: Spinal Brace Spinal Brace: Lumbar corset;Applied in sitting position Restrictions Weight Bearing Restrictions: No Other Position/Activity Restrictions: orders that pt can amb to bathroom without brace    Mobility  Bed Mobility Overal bed mobility: Needs Assistance Bed Mobility: Sidelying to Sit   Sidelying to sit: Supervision       General bed mobility comments: Pt sitting up in recliner chair upon PT arrival.   Transfers Overall transfer level: Needs assistance Equipment used: Rolling walker (2 wheeled) Transfers: Sit to/from Stand Sit to Stand: Supervision         General transfer comment: increased time and verbal cues to maintain back precautions.   Ambulation/Gait Ambulation/Gait assistance: Min guard;Supervision Ambulation Distance (Feet): 400 Feet Assistive device: Rolling walker (2 wheeled) Gait  Pattern/deviations: Decreased stride length;Step-to pattern Gait velocity: Decreased Gait velocity interpretation: Below normal speed for age/gender General Gait Details: VC's for improved posture and general safety with the RW. Pt progressed from needing min guard to supervision for supervision for safety.    Stairs Stairs: Yes   Stair Management: One rail Right;Step to pattern;Forwards Number of Stairs: 1 (x2) General stair comments: VC's for sequencing and general safety.   Wheelchair Mobility    Modified Rankin (Stroke Patients Only)       Balance Overall balance assessment: Needs assistance Sitting-balance support: Feet supported;No upper extremity supported Sitting balance-Leahy Scale: Good     Standing balance support: Bilateral upper extremity supported;During functional activity Standing balance-Leahy Scale: Fair Standing balance comment: Relies on RW for UE support during functional mobility. Pt able to maintain static standing balance with no UE support.                             Cognition Arousal/Alertness: Awake/alert Behavior During Therapy: WFL for tasks assessed/performed Overall Cognitive Status: Within Functional Limits for tasks assessed                                        Exercises      General Comments General comments (skin integrity, edema, etc.): N/T in R foot      Pertinent Vitals/Pain Pain Assessment: Faces Faces Pain Scale: Hurts a little bit Pain Location: surgical site Pain Descriptors / Indicators: Sore;Operative site guarding;Discomfort Pain Intervention(s): Limited activity within patient's tolerance;Monitored during session;Repositioned    Home Living Family/patient expects to be discharged to:: Private residence  Living Arrangements: Spouse/significant other Available Help at Discharge: Family;Available PRN/intermittently Type of Home: House Home Access: Stairs to enter Entrance Stairs-Rails:  None Home Layout: One level;Laundry or work area in basement;Able to live on main level with bedroom/bathroom Home Equipment: Bedside commode;Shower seat;Adaptive equipment      Prior Function Level of Independence: Independent      Comments: Pt reports being out of work since Spring 2018 due to severe leg and back pain.    PT Goals (current goals can now be found in the care plan section) Acute Rehab PT Goals Patient Stated Goal: To go home  PT Goal Formulation: With patient Time For Goal Achievement: 01/08/17 Potential to Achieve Goals: Good Progress towards PT goals: Progressing toward goals    Frequency    Min 5X/week      PT Plan Current plan remains appropriate    Co-evaluation              AM-PAC PT "6 Clicks" Daily Activity  Outcome Measure  Difficulty turning over in bed (including adjusting bedclothes, sheets and blankets)?: None Difficulty moving from lying on back to sitting on the side of the bed? : A Little Difficulty sitting down on and standing up from a chair with arms (e.g., wheelchair, bedside commode, etc,.)?: A Little Help needed moving to and from a bed to chair (including a wheelchair)?: A Little Help needed walking in hospital room?: A Little Help needed climbing 3-5 steps with a railing? : A Little 6 Click Score: 19    End of Session Equipment Utilized During Treatment: Gait belt;Back brace Activity Tolerance: Patient tolerated treatment well Patient left: in chair;with call bell/phone within reach Nurse Communication: Mobility status PT Visit Diagnosis: Pain;Difficulty in walking, not elsewhere classified (R26.2) Pain - part of body:  (back)     Time: 8937-3428 PT Time Calculation (min) (ACUTE ONLY): 18 min  Charges:  $Gait Training: 8-22 mins                    G Codes:       Rolinda Roan, PT, DPT Acute Rehabilitation Services Pager: [place] 01/02/2017, 10:02 AM

## 2017-01-02 NOTE — Progress Notes (Signed)
Pt received RW from [place] prior to D/C per MD order. Holli Humbles, RN

## 2017-01-02 NOTE — Therapy (Signed)
Occupational Therapy Evaluation Patient Details Name: Christy Brewer MRN: [id number] DOB: [birth year] Today's Date: 01/02/2017    History of Present Illness Pt is 56 yo female s/p PLIF L5-S1. PMH: HTN, DM, bilat LE neuropathy. PSH: back surgery, breast lumpectomy.   Clinical Impression   Pt reports being independent in ADLs but limited in functional mobility due to severe leg/back pain PTA. Currently pt requires min assist for LB ADLs and min guard for functional mobility/transfers at RW level. Pt reports family is available 24 hours initially (2 days) but will then only be able to provide intermittent supervision. Pt would benefit from acute OT services to increase independence with LB ADLs and functional mobility. OT will follow acutely to address established goals.     Follow Up Recommendations  No OT follow up;Supervision - Intermittent    Equipment Recommendations  None recommended by OT    Recommendations for Other Services       Precautions / Restrictions Precautions Precautions: Back Precaution Booklet Issued: Yes (comment) Precaution Comments: Reviewed back precaution handout with pt.  Required Braces or Orthoses: Spinal Brace Spinal Brace: Lumbar corset;Applied in sitting position Restrictions Weight Bearing Restrictions: No      Mobility Bed Mobility Overal bed mobility: Needs Assistance Bed Mobility: Sidelying to Sit   Sidelying to sit: Supervision       General bed mobility comments: pt in sidelying upon arrival. increased time but not physical assist required. Good technique.   Transfers Overall transfer level: Needs assistance Equipment used: Rolling walker (2 wheeled) Transfers: Sit to/from Stand Sit to Stand: Min guard         General transfer comment: increased time and verbal cues to maintain back precautions.     Balance Overall balance assessment: Needs assistance Sitting-balance support: Feet supported;No upper extremity supported Sitting  balance-Leahy Scale: Good     Standing balance support: Bilateral upper extremity supported;During functional activity Standing balance-Leahy Scale: Fair Standing balance comment: Relies on RW for UE support during functional mobility. Pt able to maintain static standing balance with no UE support.                            ADL either performed or assessed with clinical judgement   ADL Overall ADL's : Needs assistance/impaired     Grooming: Supervision/safety;Set up;Sitting Grooming Details (indicate cue type and reason): Pt educated on two cup method for oral care to maintain back precautions.  Upper Body Bathing: Supervision/ safety;Set up;Sitting   Lower Body Bathing: Minimal assistance;Sitting/lateral leans   Upper Body Dressing : Supervision/safety;Set up;Sitting Upper Body Dressing Details (indicate cue type and reason): Pt able to don back brace with increased time. Pt able to verbalize brace wear scheudle  Lower Body Dressing: Minimal assistance;Sit to/from stand   Toilet Transfer: Min guard;Ambulation;Comfort height toilet;RW   Toileting- Clothing Manipulation and Hygiene: Supervision/safety;Set up;Sit to/from stand Toileting - Clothing Manipulation Details (indicate cue type and reason): Pt reports that she is able to complete peri care without twisting.      Functional mobility during ADLs: Min guard;Rolling walker General ADL Comments: Pt currently unable to bring feet over knees for LB ADLs. Pt reports having a reacher and sock aide at home. Pt would benefit from demonstration of how to use equipment as family is only available intermittently. Pt advised to have assistance with showering initially upon d/c and plan to review shower transfer in upcoming session.      Vision  Perception     Praxis      Pertinent Vitals/Pain Pain Assessment: No/denies pain     Hand Dominance     Extremity/Trunk Assessment Upper Extremity Assessment Upper  Extremity Assessment: Overall WFL for tasks assessed   Lower Extremity Assessment Lower Extremity Assessment: Defer to PT evaluation   Cervical / Trunk Assessment Cervical / Trunk Assessment: Other exceptions Cervical / Trunk Exceptions: back surgery   Communication Communication Communication: No difficulties   Cognition Arousal/Alertness: Awake/alert Behavior During Therapy: WFL for tasks assessed/performed Overall Cognitive Status: Within Functional Limits for tasks assessed                                     General Comments  Pt reports numbness and tingling in her RLE down into her foot. Pt reports that the RLE feels different from the LLE.     Exercises     Shoulder Instructions      Home Living Family/patient expects to be discharged to:: Private residence Living Arrangements: Spouse/significant other Available Help at Discharge: Family;Available PRN/intermittently Type of Home: House Home Access: Stairs to enter [employer] of Steps: 1 Entrance Stairs-Rails: None Home Layout: One level;Laundry or work area in basement;Able to live on main level with bedroom/bathroom     Bathroom Shower/Tub: Teacher, early years/pre: Standard Bathroom Accessibility: Yes How Accessible: Accessible via walker Home Equipment: Bedside commode;Shower seat;Adaptive equipment Adaptive Equipment: Reacher;Sock aid;Long-handled sponge        Prior Functioning/Environment Level of Independence: Independent        Comments: Pt reports being out of work since Spring 2018 due to severe leg and back pain.         OT Problem List: Decreased knowledge of use of DME or AE;Decreased knowledge of precautions;Decreased safety awareness      OT Treatment/Interventions: DME and/or AE instruction;Therapeutic activities;Cognitive remediation/compensation;Balance training;Patient/family education;Self-care/ADL training    OT Goals(Current goals can be  found in the care plan section) Acute Rehab OT Goals Patient Stated Goal: To go home  OT Goal Formulation: With patient Time For Goal Achievement: 01/16/17 Potential to Achieve Goals: Good ADL Goals Pt Will Perform Lower Body Bathing: with supervision;sit to/from stand (AE as needed) Pt Will Perform Lower Body Dressing: with supervision;sit to/from stand (AE as needed) Pt Will Perform Tub/Shower Transfer: with supervision;ambulating;shower seat;rolling walker  OT Frequency: Min 2X/week   Barriers to D/C:            Co-evaluation              AM-PAC PT "6 Clicks" Daily Activity     Outcome Measure Help from another person eating meals?: None Help from another person taking care of personal grooming?: None Help from another person toileting, which includes using toliet, bedpan, or urinal?: None Help from another person bathing (including washing, rinsing, drying)?: A Little Help from another person to put on and taking off regular upper body clothing?: None Help from another person to put on and taking off regular lower body clothing?: A Little 6 Click Score: 22   End of Session Equipment Utilized During Treatment: Gait belt;Rolling walker Nurse Communication: Mobility status  Activity Tolerance: Patient tolerated treatment well Patient left: in chair;with call bell/phone within reach  OT Visit Diagnosis: Unsteadiness on feet (R26.81)                Time: 8657-8469 OT Time Calculation (min): 23  min Charges:  OT General Charges $OT Visit: 1 Visit OT Evaluation $OT Eval Moderate Complexity: 1 Mod OT Treatments $Self Care/Home Management : 8-22 mins G-Codes:     Boykin Peek, OTS [phone]   Boykin Peek 01/02/2017, 9:24 AM

## 2019-09-20 IMAGING — RF DG LUMBAR SPINE 2-3V
1 series · 2 of 2 positions shown · non-contrast
Comparison: 08/29/2016

CLINICAL DATA: Lumbosacral surgery.  Two views

EXAM:
DG C-ARM 61-120 MIN; LUMBAR SPINE - 2-3 VIEW

[Series 1: run · 2 of 2 slices shown]
[im 1/2]
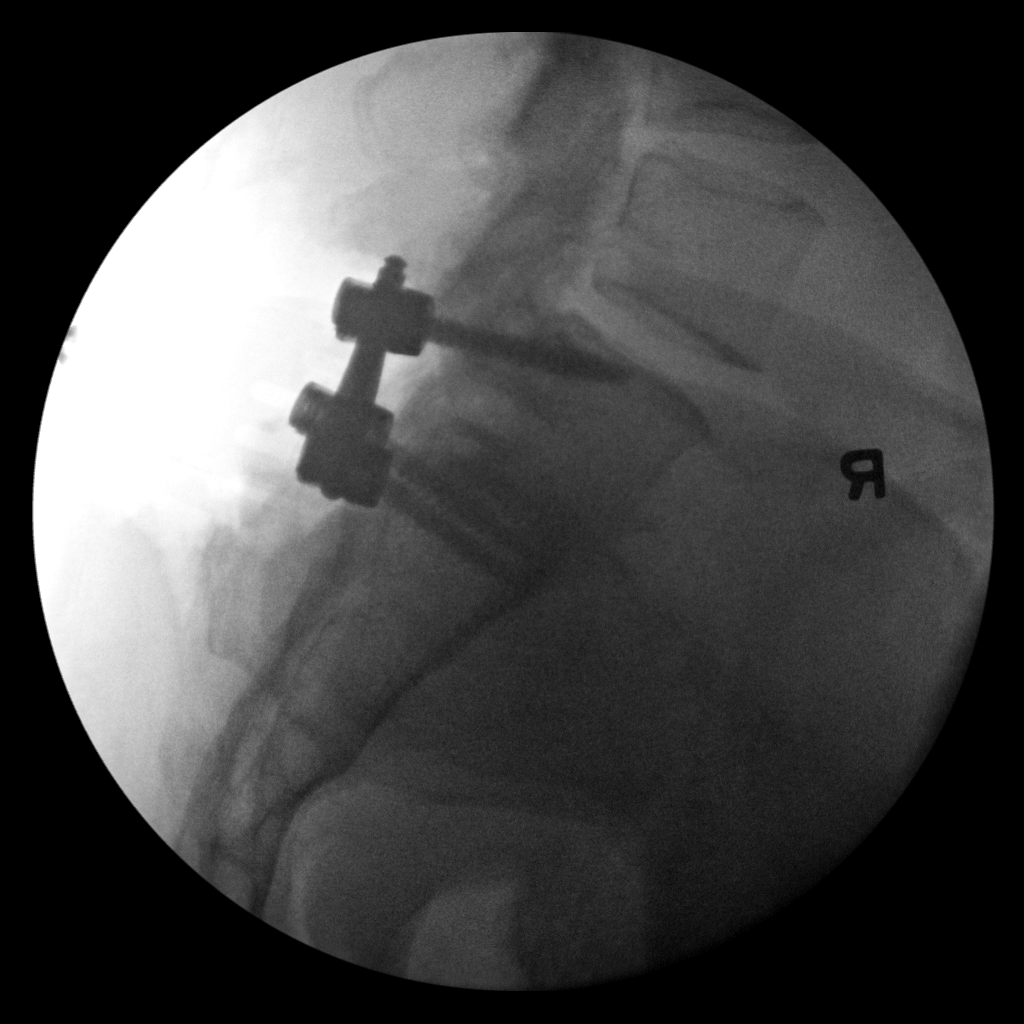
[im 2/2]
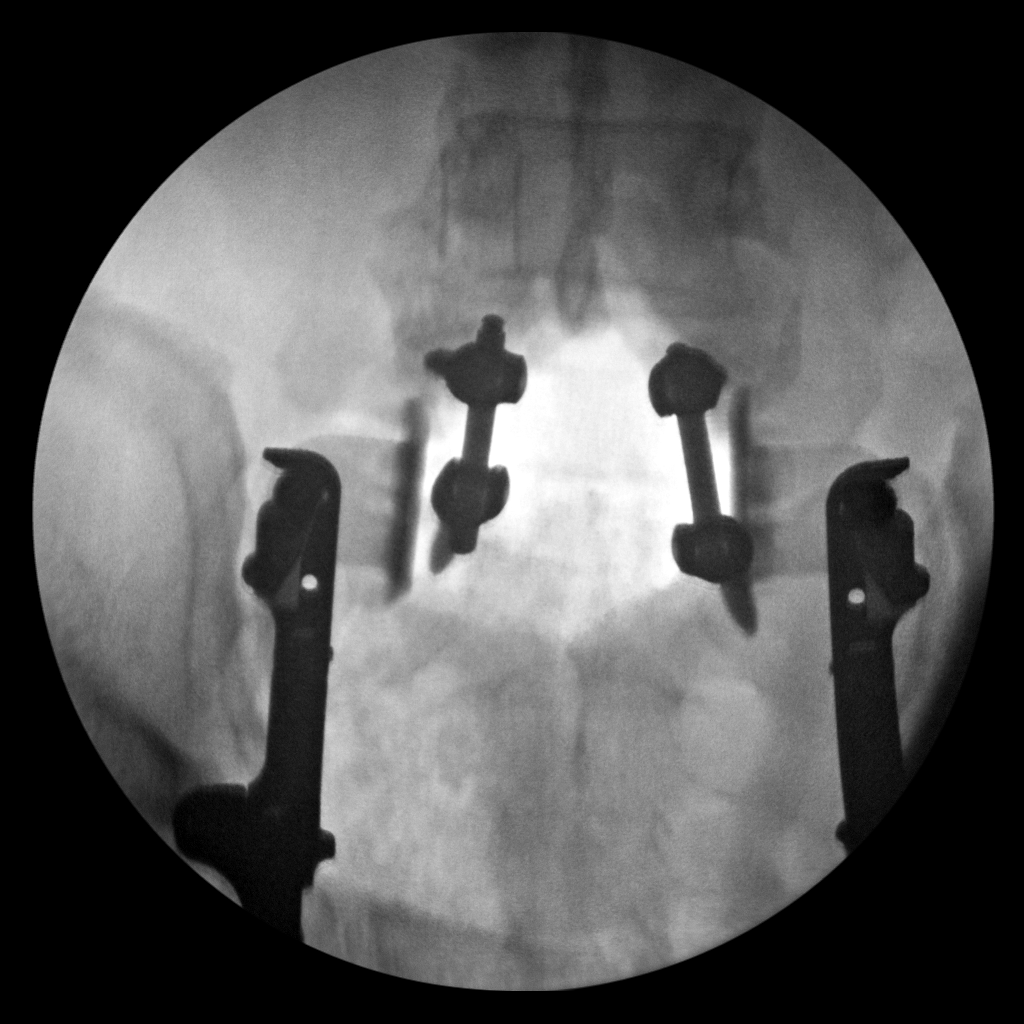

[2 of 2 positions shown; findings below may reference images not displayed]

FINDINGS: Two-view show posterior decompression, diskectomy and fusion at the
L5-S1 level. Pedicle screws and posterior rods are in place.
IMPRESSION: PLIF L5-S1.
